# Patient Record
Sex: Female | Born: 1935 | Race: Black or African American | Hispanic: No | Smoking: Never smoker
Health system: Southern US, Community
[De-identification: ages and names within clinical notes are randomized; demographics above are authoritative.]

## PROBLEM LIST (undated history)

## (undated) DIAGNOSIS — R221 Localized swelling, mass and lump, neck: Secondary | ICD-10-CM

## (undated) DIAGNOSIS — E119 Type 2 diabetes mellitus without complications: Secondary | ICD-10-CM

## (undated) DIAGNOSIS — I350 Nonrheumatic aortic (valve) stenosis: Secondary | ICD-10-CM

## (undated) DIAGNOSIS — I10 Essential (primary) hypertension: Secondary | ICD-10-CM

## (undated) DIAGNOSIS — K529 Noninfective gastroenteritis and colitis, unspecified: Secondary | ICD-10-CM

## (undated) DIAGNOSIS — R0789 Other chest pain: Secondary | ICD-10-CM

## (undated) DIAGNOSIS — I679 Cerebrovascular disease, unspecified: Secondary | ICD-10-CM

## (undated) DIAGNOSIS — I871 Compression of vein: Secondary | ICD-10-CM

## (undated) DIAGNOSIS — Z950 Presence of cardiac pacemaker: Secondary | ICD-10-CM

## (undated) DIAGNOSIS — J439 Emphysema, unspecified: Secondary | ICD-10-CM

## (undated) DIAGNOSIS — R9430 Abnormal result of cardiovascular function study, unspecified: Secondary | ICD-10-CM

## (undated) DIAGNOSIS — I739 Peripheral vascular disease, unspecified: Secondary | ICD-10-CM

## (undated) HISTORY — DX: Noninfective gastroenteritis and colitis, unspecified: K52.9

## (undated) HISTORY — DX: Cerebrovascular disease, unspecified: I67.9

## (undated) HISTORY — DX: Emphysema, unspecified: J43.9

## (undated) HISTORY — DX: Localized swelling, mass and lump, neck: R22.1

## (undated) HISTORY — DX: Type 2 diabetes mellitus without complications: E11.9

## (undated) HISTORY — DX: Nonrheumatic aortic (valve) stenosis: I35.0

## (undated) HISTORY — DX: Peripheral vascular disease, unspecified: I73.9

## (undated) HISTORY — DX: Essential (primary) hypertension: I10

## (undated) HISTORY — DX: Abnormal result of cardiovascular function study, unspecified: R94.30

## (undated) HISTORY — DX: Other chest pain: R07.89

## (undated) HISTORY — DX: Presence of cardiac pacemaker: Z95.0

## (undated) HISTORY — DX: Compression of vein: I87.1

---

## 2004-11-08 ENCOUNTER — Inpatient Hospital Stay (HOSPITAL_COMMUNITY): Admission: AD | Admit: 2004-11-08 | Discharge: 2004-11-16 | Payer: Self-pay | Admitting: Cardiology

## 2004-11-08 ENCOUNTER — Ambulatory Visit: Payer: Self-pay | Admitting: Cardiology

## 2004-11-10 ENCOUNTER — Ambulatory Visit: Payer: Self-pay | Admitting: Pulmonary Disease

## 2004-11-11 ENCOUNTER — Ambulatory Visit: Payer: Self-pay | Admitting: Cardiology

## 2004-11-13 ENCOUNTER — Ambulatory Visit: Payer: Self-pay | Admitting: Cardiology

## 2004-11-13 ENCOUNTER — Encounter: Payer: Self-pay | Admitting: Cardiology

## 2004-11-30 ENCOUNTER — Ambulatory Visit: Payer: Self-pay | Admitting: Cardiology

## 2006-05-02 ENCOUNTER — Ambulatory Visit: Payer: Self-pay | Admitting: Cardiology

## 2006-05-23 ENCOUNTER — Ambulatory Visit: Payer: Self-pay | Admitting: Cardiology

## 2006-12-19 ENCOUNTER — Ambulatory Visit: Payer: Self-pay | Admitting: Cardiology

## 2006-12-27 ENCOUNTER — Ambulatory Visit: Payer: Self-pay | Admitting: Cardiology

## 2007-04-04 ENCOUNTER — Ambulatory Visit: Payer: Self-pay | Admitting: Gastroenterology

## 2007-04-09 ENCOUNTER — Ambulatory Visit: Payer: Self-pay | Admitting: Gastroenterology

## 2007-04-22 ENCOUNTER — Ambulatory Visit (HOSPITAL_COMMUNITY): Admission: RE | Admit: 2007-04-22 | Discharge: 2007-04-22 | Payer: Self-pay | Admitting: Gastroenterology

## 2007-04-22 ENCOUNTER — Ambulatory Visit: Payer: Self-pay | Admitting: Gastroenterology

## 2007-05-22 ENCOUNTER — Ambulatory Visit: Payer: Self-pay | Admitting: Gastroenterology

## 2007-06-17 ENCOUNTER — Ambulatory Visit: Payer: Self-pay | Admitting: Cardiology

## 2007-06-17 ENCOUNTER — Encounter: Payer: Self-pay | Admitting: Cardiology

## 2007-06-25 ENCOUNTER — Ambulatory Visit: Payer: Self-pay | Admitting: Cardiology

## 2007-08-03 ENCOUNTER — Encounter: Payer: Self-pay | Admitting: Cardiology

## 2007-08-06 ENCOUNTER — Ambulatory Visit: Payer: Self-pay | Admitting: Gastroenterology

## 2007-08-11 ENCOUNTER — Ambulatory Visit (HOSPITAL_COMMUNITY): Admission: RE | Admit: 2007-08-11 | Discharge: 2007-08-11 | Payer: Self-pay | Admitting: Gastroenterology

## 2007-08-14 ENCOUNTER — Ambulatory Visit: Payer: Self-pay | Admitting: Cardiology

## 2007-08-18 ENCOUNTER — Ambulatory Visit: Payer: Self-pay | Admitting: Cardiology

## 2007-09-03 ENCOUNTER — Ambulatory Visit: Payer: Self-pay | Admitting: Cardiology

## 2007-09-16 ENCOUNTER — Encounter: Payer: Self-pay | Admitting: Cardiology

## 2007-09-16 ENCOUNTER — Ambulatory Visit: Payer: Self-pay | Admitting: Cardiology

## 2007-10-27 ENCOUNTER — Ambulatory Visit: Payer: Self-pay | Admitting: Cardiology

## 2007-11-11 ENCOUNTER — Ambulatory Visit: Payer: Self-pay | Admitting: Gastroenterology

## 2007-12-16 ENCOUNTER — Ambulatory Visit: Payer: Self-pay | Admitting: Cardiology

## 2008-03-10 ENCOUNTER — Ambulatory Visit: Payer: Self-pay | Admitting: Cardiology

## 2008-03-24 ENCOUNTER — Ambulatory Visit: Payer: Self-pay | Admitting: Cardiology

## 2008-04-02 ENCOUNTER — Ambulatory Visit: Payer: Self-pay | Admitting: Cardiology

## 2008-06-24 ENCOUNTER — Ambulatory Visit: Payer: Self-pay | Admitting: Cardiology

## 2008-08-02 ENCOUNTER — Ambulatory Visit: Payer: Self-pay | Admitting: Cardiology

## 2008-08-06 ENCOUNTER — Encounter: Payer: Self-pay | Admitting: Cardiology

## 2008-08-29 ENCOUNTER — Encounter: Payer: Self-pay | Admitting: Cardiology

## 2008-09-23 ENCOUNTER — Ambulatory Visit: Payer: Self-pay | Admitting: Cardiology

## 2008-09-30 ENCOUNTER — Encounter: Payer: Self-pay | Admitting: Cardiology

## 2008-10-04 ENCOUNTER — Encounter: Payer: Self-pay | Admitting: Cardiology

## 2008-10-04 ENCOUNTER — Encounter: Payer: Self-pay | Admitting: Physician Assistant

## 2008-10-05 ENCOUNTER — Ambulatory Visit: Payer: Self-pay | Admitting: Cardiology

## 2008-11-16 ENCOUNTER — Encounter: Payer: Self-pay | Admitting: Cardiology

## 2008-11-29 ENCOUNTER — Encounter (INDEPENDENT_AMBULATORY_CARE_PROVIDER_SITE_OTHER): Payer: Self-pay | Admitting: *Deleted

## 2008-12-03 ENCOUNTER — Encounter: Payer: Self-pay | Admitting: Cardiology

## 2008-12-04 ENCOUNTER — Ambulatory Visit: Payer: Self-pay | Admitting: Cardiology

## 2008-12-10 ENCOUNTER — Encounter: Payer: Self-pay | Admitting: Cardiology

## 2008-12-16 ENCOUNTER — Encounter: Payer: Self-pay | Admitting: Cardiology

## 2009-03-14 ENCOUNTER — Encounter: Payer: Self-pay | Admitting: Cardiology

## 2009-03-31 ENCOUNTER — Encounter: Payer: Self-pay | Admitting: Cardiology

## 2009-05-16 ENCOUNTER — Encounter: Payer: Self-pay | Admitting: Cardiology

## 2009-05-31 ENCOUNTER — Encounter: Payer: Self-pay | Admitting: Cardiology

## 2009-06-16 ENCOUNTER — Ambulatory Visit: Payer: Self-pay | Admitting: Cardiology

## 2009-06-16 DIAGNOSIS — I4949 Other premature depolarization: Secondary | ICD-10-CM

## 2009-06-16 DIAGNOSIS — D649 Anemia, unspecified: Secondary | ICD-10-CM

## 2009-06-16 DIAGNOSIS — I5033 Acute on chronic diastolic (congestive) heart failure: Secondary | ICD-10-CM

## 2009-06-16 DIAGNOSIS — I359 Nonrheumatic aortic valve disorder, unspecified: Secondary | ICD-10-CM | POA: Insufficient documentation

## 2009-06-16 DIAGNOSIS — N182 Chronic kidney disease, stage 2 (mild): Secondary | ICD-10-CM | POA: Insufficient documentation

## 2009-06-16 DIAGNOSIS — R0989 Other specified symptoms and signs involving the circulatory and respiratory systems: Secondary | ICD-10-CM

## 2009-09-29 ENCOUNTER — Encounter: Payer: Self-pay | Admitting: Cardiology

## 2009-10-03 ENCOUNTER — Ambulatory Visit: Payer: Self-pay | Admitting: Cardiology

## 2009-10-17 IMAGING — RF DG ESOPHAGUS
16 of 24 series · 16 of 24 positions shown · non-contrast
Comparison: None

CLINICAL DATA: Regurgitation, acid reflux

BARIUM SWALLOW / ESOPHAGRAM
TECHNIQUE: Routine esophagram performed including single contrast,
air contrast, and tablet imaging.  Exam utilized 69 seconds of
fluoroscopy.

[Series 1: run · 1 of 1 slices shown (1 of 16)]
[im 1/1]
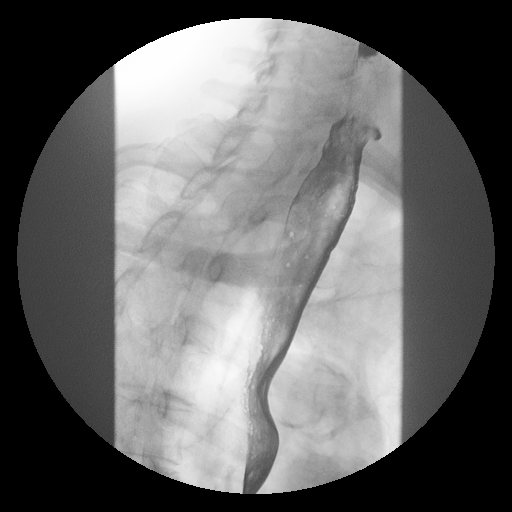

[Series 3: run · 1 of 1 slices shown (2 of 16)]
[im 1/1]
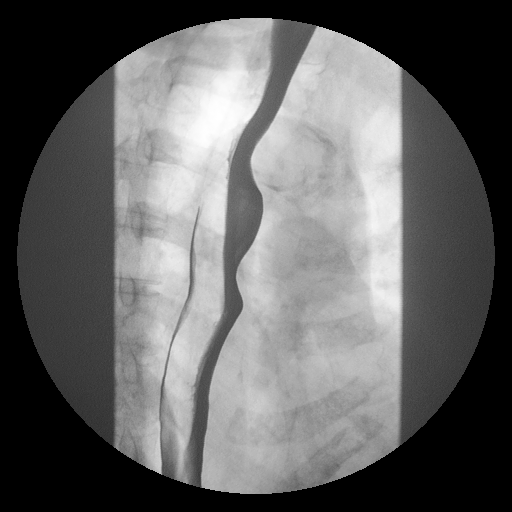

[Series 4: run · 1 of 1 slices shown (3 of 16)]
[im 1/1]
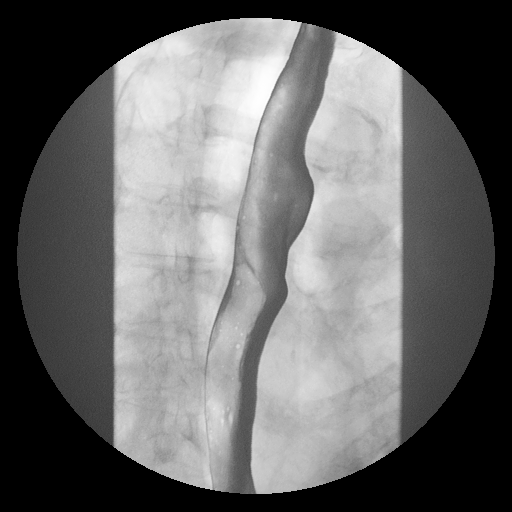

[Series 6: run · 1 of 1 slices shown (4 of 16)]
[im 1/1]
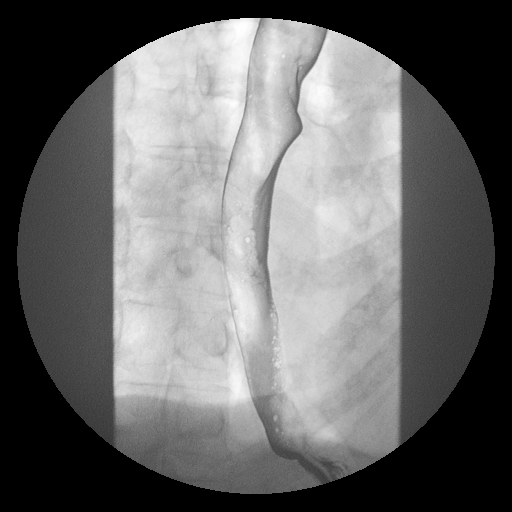

[Series 7: run · 1 of 1 slices shown (5 of 16)]
[im 1/1]
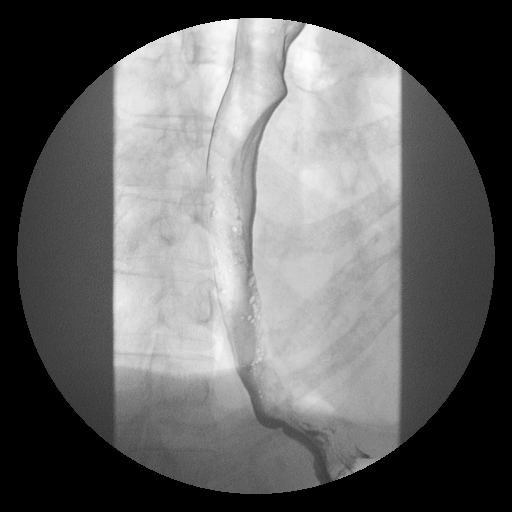

[Series 9: run · 1 of 1 slices shown (6 of 16)]
[im 1/1]
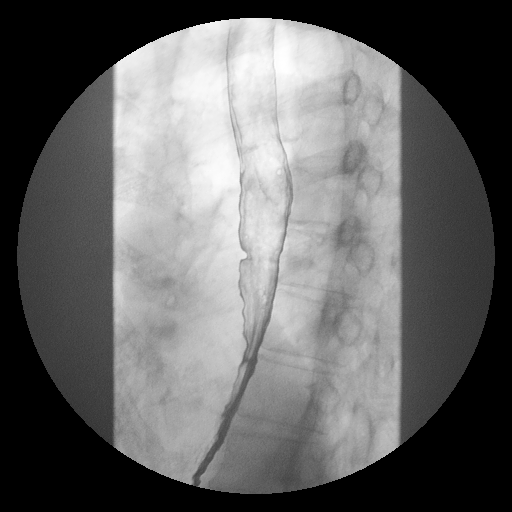

[Series 10: run · 1 of 1 slices shown (7 of 16)]
[im 1/1]
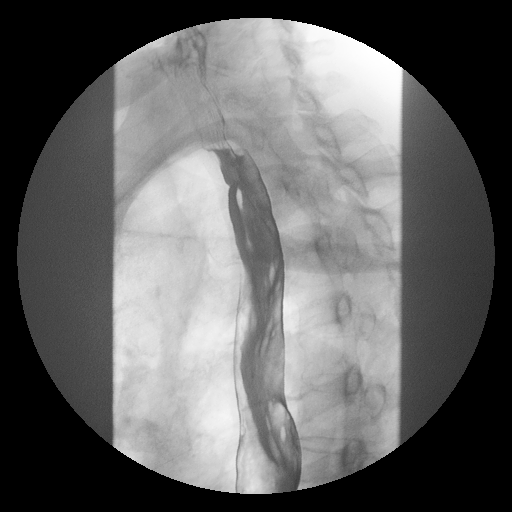

[Series 12: run · 1 of 1 slices shown (8 of 16)]
[im 1/1]
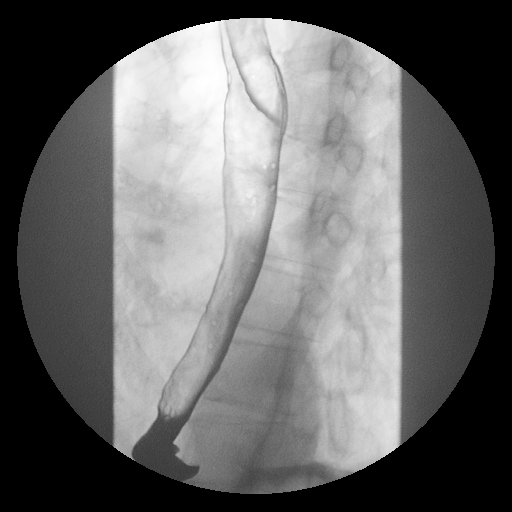

[Series 13: run · 1 of 1 slices shown (9 of 16)]
[im 1/1]
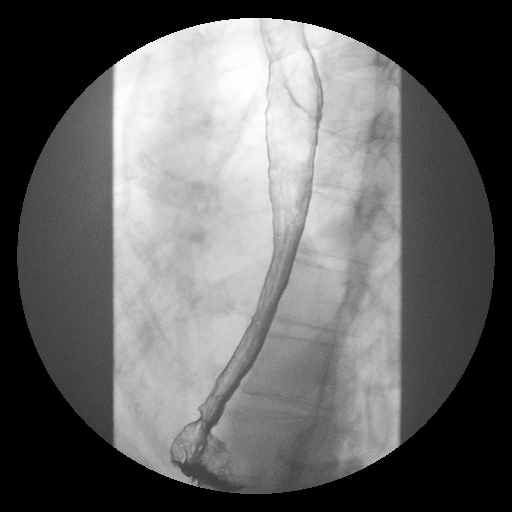

[Series 15: run · 1 of 1 slices shown (10 of 16)]
[im 1/1]
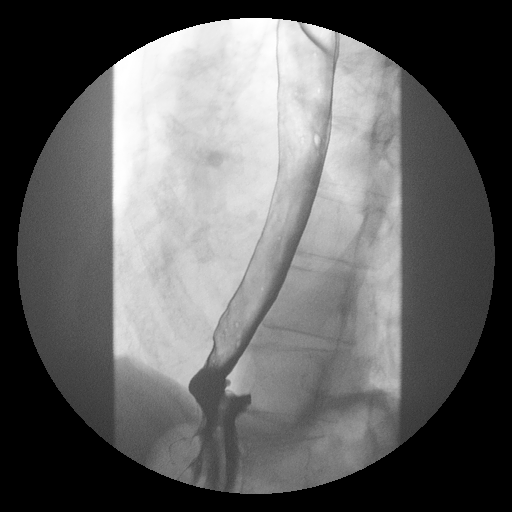

[Series 16: run · 1 of 1 slices shown (11 of 16)]
[im 1/1]
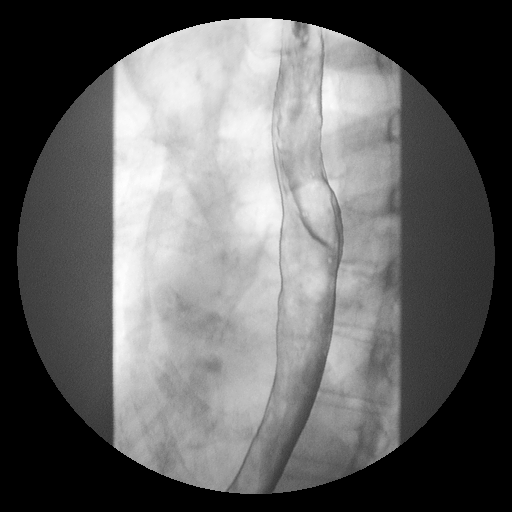

[Series 18: run · 1 of 18 slices shown (12 of 16)]
[im 1/18]
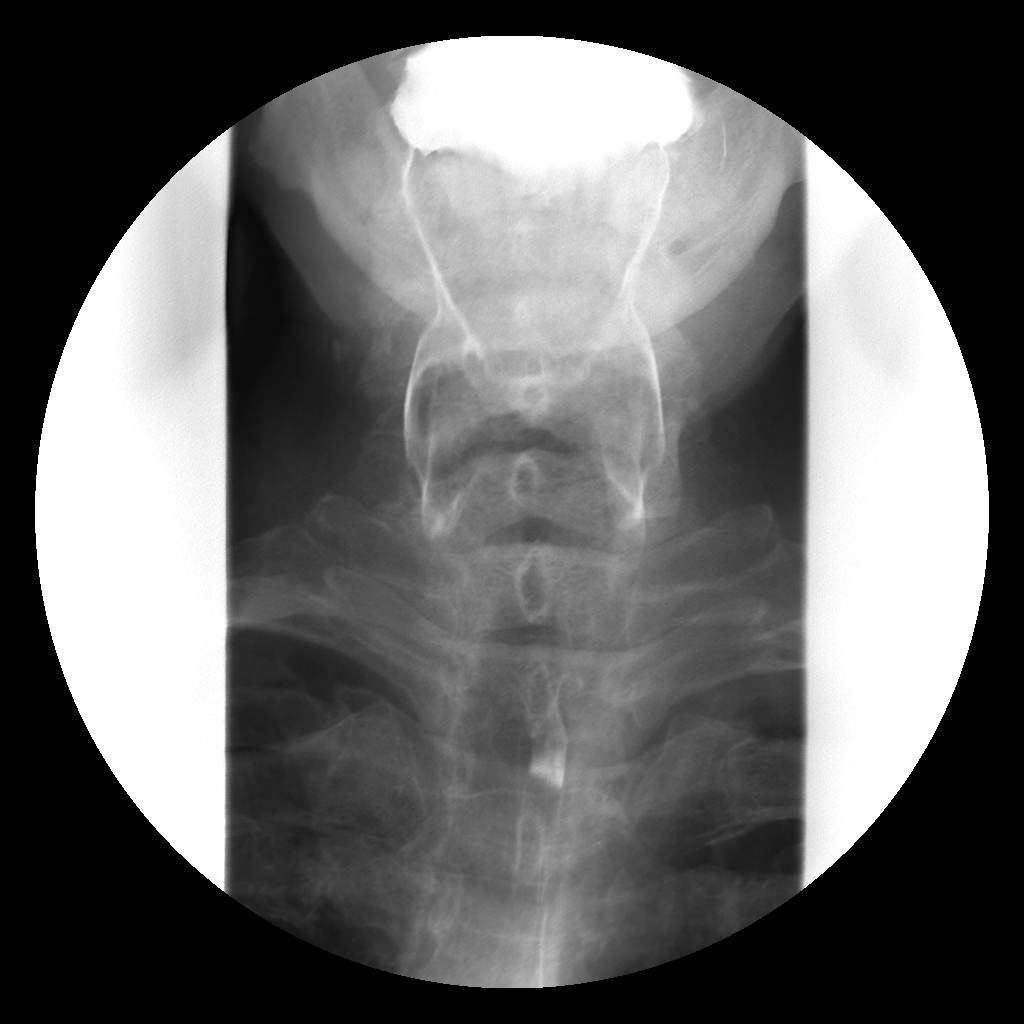

[Series 19: run · 1 of 1 slices shown (13 of 16)]
[im 1/1]
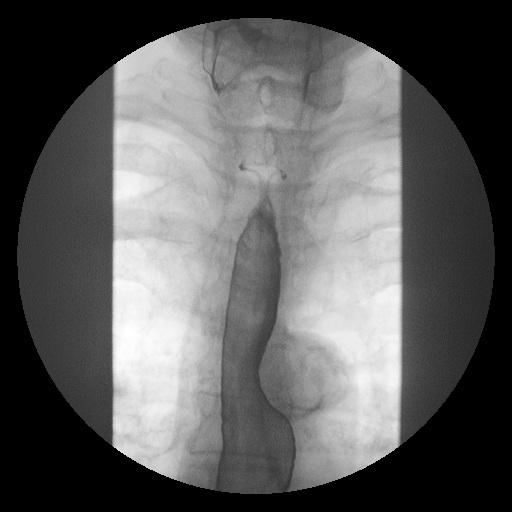

[Series 21: run · 1 of 1 slices shown (14 of 16)]
[im 1/1]
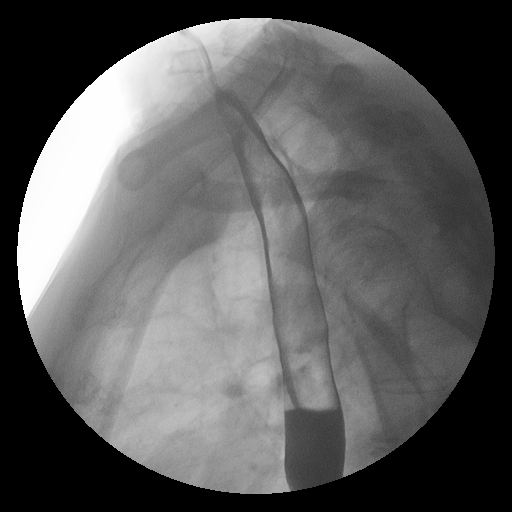

[Series 22: run · 1 of 1 slices shown (15 of 16)]
[im 1/1]
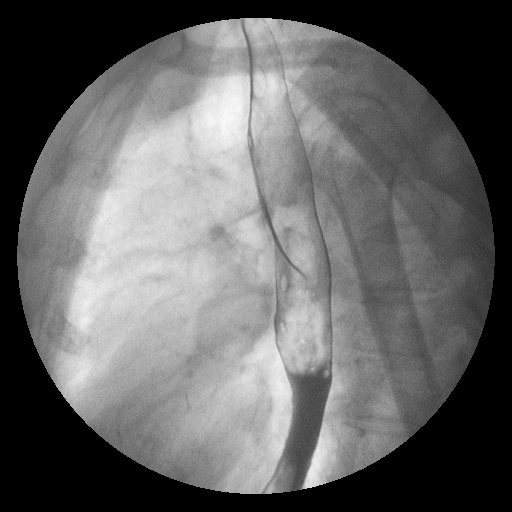

[Series 24: run · 1 of 1 slices shown (16 of 16)]
[im 1/1]
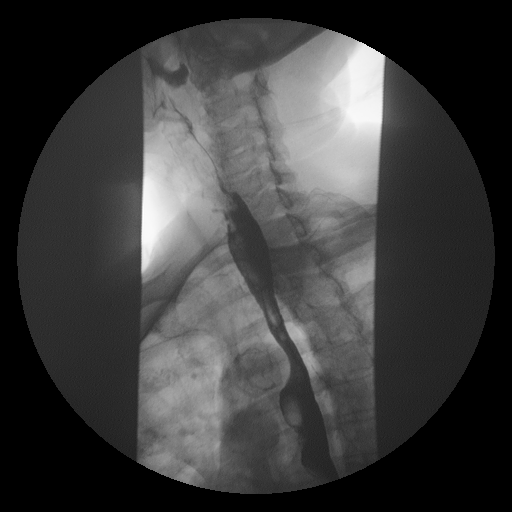

[16 of 24 positions shown; findings below may reference images not displayed]

FINDINGS: Normal esophageal distension.
Minimal age-related esophageal dysmotility.
12.5 mm diameter barium tablet freely passes from oral cavity to
stomach without delay.
Mucosa smooth without ulceration or irregularity.
No mass or stricture identified.
Tiny hiatal hernia noted with nonobstructing Schatzki's ring.
Targeted rapid sequence imaging of cervical esophagus and
hypopharynx demonstrates questionable tiny cervical esophageal
diverticulum, left lateral, seen only on the AP imaging.
No laryngeal penetration or aspiration.
IMPRESSION: Tiny hiatal hernia.
Small nonobstructing Schatzki's ring.
No other esophageal abnormalities.

## 2009-11-04 ENCOUNTER — Encounter: Payer: Self-pay | Admitting: Cardiology

## 2009-11-15 ENCOUNTER — Encounter: Payer: Self-pay | Admitting: Cardiology

## 2009-11-16 ENCOUNTER — Encounter: Payer: Self-pay | Admitting: Cardiology

## 2009-11-24 ENCOUNTER — Ambulatory Visit: Payer: Self-pay | Admitting: Cardiology

## 2009-11-24 DIAGNOSIS — Z950 Presence of cardiac pacemaker: Secondary | ICD-10-CM

## 2009-12-07 ENCOUNTER — Encounter: Payer: Self-pay | Admitting: Cardiology

## 2009-12-23 ENCOUNTER — Telehealth (INDEPENDENT_AMBULATORY_CARE_PROVIDER_SITE_OTHER): Payer: Self-pay | Admitting: *Deleted

## 2010-01-11 ENCOUNTER — Encounter: Payer: Self-pay | Admitting: Cardiology

## 2010-03-22 ENCOUNTER — Ambulatory Visit: Payer: Self-pay | Admitting: Cardiology

## 2010-03-28 ENCOUNTER — Encounter: Payer: Self-pay | Admitting: Cardiology

## 2010-03-28 ENCOUNTER — Telehealth (INDEPENDENT_AMBULATORY_CARE_PROVIDER_SITE_OTHER): Payer: Self-pay | Admitting: *Deleted

## 2010-03-29 ENCOUNTER — Ambulatory Visit: Payer: Self-pay | Admitting: Cardiology

## 2010-04-20 ENCOUNTER — Encounter: Payer: Self-pay | Admitting: Cardiology

## 2010-05-30 NOTE — Cardiovascular Report (Signed)
Summary: Cardiac Cath [facility]  Cardiac Cath [facility]   Imported By: Dorise Hiss 07/15/2009 15:10:10  _____________________________________________________________________  External Attachment:    Type:   Image     Comment:   External Document

## 2010-05-30 NOTE — Assessment & Plan Note (Signed)
Summary: 6 month fu   Visit Type:  Follow-up Primary Provider:  Sherril Croon  CC:  follow-up visit.  History of Present Illness: the patient is an 77 year old female with history of severe aortic stenosis. The patient is being evaluated for TAVI. She had a prior balloon angioplasty of the aortic valve. She continues to have severe aortic stenosis. She had a hospitalization for heart failure several months ago. Her symptoms have improved. She also had a workup with catheterization at [facility] showing no significant CAD. She is now followed closely by [facility]. The patient denies any orthopnea PND palpitations or syncope.  Preventive Screening-Counseling & Management  Alcohol-Tobacco     Smoking Status: never  Current Problems (verified): 1)  Acute On Chronic Diastolic Heart Failure  (ICD-428.33) 2)  Carotid Bruit  (ICD-785.9) 3)  Renal Disease, Chronic, Mild  (ICD-585.2) 4)  Anemia, Normocytic  (ICD-285.9) 5)  Ventricular Ectopy  (ICD-427.69) 6)  Aortic Stenosis, Severe  (ICD-424.1)  Current Medications (verified): 1)  Metoprolol Tartrate 50 Mg Tabs (Metoprolol Tartrate) .... Take One Tablet By Mouth Twice A Day 2)  Spiriva Handihaler 18 Mcg Caps (Tiotropium Bromide Monohydrate) .... One Daily 3)  Advair Diskus 250-50 Mcg/dose Misc (Fluticasone-Salmeterol) .... Twice A Day 4)  Baby Aspirin 81 Mg Chew (Aspirin) .... One Tablet By Mouth Daily 5)  Multivitamins  Tabs (Multiple Vitamin) .... One Tablet By Mouth Daily 6)  Protonix 40 Mg Pack (Pantoprazole Sodium) .... One Tablet By Mouth Two Times A Day 7)  Atacand 32 Mg Tabs (Candesartan Cilexetil) .... One Half Tablet By Mouth Two Times Daily 8)  Airs Disposable Nebulizer  Misc (Nebulizers) .... 4 Times A Day 9)  Norvasc 5 Mg Tabs (Amlodipine Besylate) .... Take 1 Tablet By Mouth Two Times A Day 10)  Clonidine Hcl 0.1 Mg/24hr Ptwk (Clonidine Hcl) .... Apply Once A Week 11)  Bumetanide 0.5 Mg Tabs (Bumetanide) .... P.r.n. As  Needed 12)  Albuterol Mdi .... P.r.n. As Needed 13)  Albuterol Nebulizer Solution .... P.r.n. As Needed 14)  Nitrostat 0.4 Mg Subl (Nitroglycerin) .... Place 1 Tablet Under Tongue As Directed 15)  Imdur 30 Mg Xr24h-Tab (Isosorbide Mononitrate) .... Take 1 Tablet By Mouth Once A Day 16)  Iron 325 (65 Fe) Mg Tabs (Ferrous Sulfate) .Marland Kitchen.. 1tablet By Mouth Once Daily 17)  Pletal 100 Mg Tabs (Cilostazol) .... Take 1 Tablet By Mouth Two Times A Day 18)  Caltrate 600+d 600-400 Mg-Unit Tabs (Calcium Carbonate-Vitamin D) .... Take 1 Tablet By Mouth Two Times A Day 19)  Claritin 10 Mg Tabs (Loratadine) .... Take 1 Tablet By Mouth Once A Day  Allergies (verified): 1)  ! * (Ace Inhibitors)  Comments:  Nurse/Medical Assistant: The patient's medications and allergies were reviewed with the patient and were updated in the Medication and Allergy Lists. List reviewed.  Past History:  Past Medical History: Last updated: 06/16/2009 severe aortic stenosis normal coronary arteries. patient had percutaneous valvuloplasty done by balloon catheter at [facility].  This was done in May of 2009 peripheral vascular disease type II diabetes asthma hypertension history of gastroenteritis.  Admission last was 06-01-2005 echocardiogram in 2008 showed preserved LV function with ejection fraction of 55 to 60%. Vavle Diameter 0.9 to 0.1 cm per square chronic diastolic heart failure atypical chest pain chronic obstructive pulmonary disease peripheral artery disease noncritical cerebrovascular disease  Family History: Last updated: 06/23/2008 Negative FH of Diabetes, Hypertension, or Coronary Artery Disease  Social History: Last updated: 06/23/2008 Tobacco Use - No.  Alcohol Use - no  Risk Factors: Smoking Status: never (06/16/2009)  Review of Systems       The patient complains of shortness of breath and leg swelling.  The patient denies fatigue, malaise, fever, weight gain/loss, vision loss,  decreased hearing, hoarseness, chest pain, palpitations, prolonged cough, wheezing, sleep apnea, coughing up blood, abdominal pain, blood in stool, nausea, vomiting, diarrhea, heartburn, incontinence, blood in urine, muscle weakness, joint pain, rash, skin lesions, headache, fainting, dizziness, depression, anxiety, enlarged lymph nodes, easy bruising or bleeding, and environmental allergies.    Vital Signs:  Patient profile:   77 year old female Height:      61 inches Weight:      121 pounds Pulse rate:   97 / minute BP sitting:   127 / 65  (left arm) Cuff size:   regular  Vitals Entered By: Carlye Grippe (June 16, 2009 9:50 AM) CC: follow-up visit   Physical Exam  Additional Exam:  General: Well-developed, well-nourished in no distress head: Normocephalic and atraumatic eyes PERRLA/EOMI intact, conjunctiva and lids normal nose: No deformity or lesions mouth normal dentition, normal posterior pharynx neck: Supple, no JVD.  No masses, thyromegaly or abnormal cervical nodes. diminished carotid upstroke with soft bilateral bruits. lungs: Normal breath sounds bilaterally without wheezing.  Normal percussion heart: regular rate and rhythm with normal S1 and S2, no S3 or S4.  PMI is normal.  there is a harsh 3-4/6 crescendo decrescendo murmur at the right upper sternal border. abdomen: Normal bowel sounds, abdomen is soft and nontender without masses, organomegaly or hernias noted.  No hepatosplenomegaly musculoskeletal: Back normal, normal gait muscle strength and tone normal pulsus: Pulse is normal in all 4 extremities Extremities: 1+ peripheral pitting edema neurologic: Alert and oriented x 3 skin: Intact without lesions or rashes cervical nodes: No significant adenopathy psychologic: Normal affect    Impression & Recommendations:  Problem # 1:  ACUTE ON CHRONIC DIASTOLIC HEART FAILURE (ICD-428.33) the patient is currently euvolemic. I made no changes in her diuretic  regimen. The following medications were removed from the medication list:    Lopressor 50 Mg Tabs (Metoprolol tartrate) .Marland Kitchen... Take 1/2 tablet by mouth twice a day Her updated medication list for this problem includes:    Metoprolol Tartrate 50 Mg Tabs (Metoprolol tartrate) .Marland Kitchen... Take one tablet by mouth twice a day    Baby Aspirin 81 Mg Chew (Aspirin) ..... One tablet by mouth daily    Atacand 32 Mg Tabs (Candesartan cilexetil) ..... One half tablet by mouth two times daily    Norvasc 5 Mg Tabs (Amlodipine besylate) .Marland Kitchen... Take 1 tablet by mouth two times a day    Bumetanide 0.5 Mg Tabs (Bumetanide) .Marland Kitchen... P.r.n. as needed    Nitrostat 0.4 Mg Subl (Nitroglycerin) .Marland Kitchen... Place 1 tablet under tongue as directed    Imdur 30 Mg Xr24h-tab (Isosorbide mononitrate) .Marland Kitchen... Take 1 tablet by mouth once a day    Pletal 100 Mg Tabs (Cilostazol) .Marland Kitchen... Take 1 tablet by mouth two times a day  Problem # 2:  CAROTID BRUIT (ICD-785.9) Assessment: Comment Only  Problem # 3:  AORTIC STENOSIS, SEVERE (ICD-424.1) Assessment: Comment Only the patient is followed closely at [facility] and may be scheduled for percutaneous valve implantation. The following medications were removed from the medication list:    Lopressor 50 Mg Tabs (Metoprolol tartrate) .Marland Kitchen... Take 1/2 tablet by mouth twice a day Her updated medication list for this problem includes:    Metoprolol Tartrate 50 Mg  Tabs (Metoprolol tartrate) .Marland Kitchen... Take one tablet by mouth twice a day    Atacand 32 Mg Tabs (Candesartan cilexetil) ..... One half tablet by mouth two times daily    Bumetanide 0.5 Mg Tabs (Bumetanide) .Marland Kitchen... P.r.n. as needed    Nitrostat 0.4 Mg Subl (Nitroglycerin) .Marland Kitchen... Place 1 tablet under tongue as directed    Imdur 30 Mg Xr24h-tab (Isosorbide mononitrate) .Marland Kitchen... Take 1 tablet by mouth once a day  Problem # 4:  RENAL DISEASE, CHRONIC, MILD (ICD-585.2) Assessment: Comment Only  Patient Instructions: 1)  Your physician recommends that  you continue on your current medications as directed. Please refer to the Current Medication list given to you today. 2)  Follow up in  6 months.

## 2010-05-30 NOTE — Letter (Signed)
Summary: Appointment- Rescheduled  [facility] [address]   [place], [place] [address]   Phone: [phone]  Fax: [phone]     November 15, 2009 MRN: [id number]     [name] [address] [place], [place]  [address]     Dear Ms. Stoker,   Due to a change in our office schedule, your appointment on  July 28,2011 at 2;45 pm must be changed.    Your new appointment time on July 28th, 2011  will be 2:15 pm.   We look forward to participating in your health care needs.     Sincerely,  Glass blower/designer

## 2010-05-30 NOTE — Progress Notes (Signed)
Summary: edema in ankles  Phone Note Call from Patient   Summary of Call: Patient c/o edema in ankles and just a little going up into legs.  Going on x 1 week.  No weight gain.  Does have SOB, but states is normal for her.  Did call PMD and they told her to call Cardiologist since it may be related to one of the meds given to her at [facility].   Hoover Brunette, LPN  December 23, 2009 5:16 PM   Follow-up for Phone Call        probably related to Norvasc.  Patient should check blood pressure is within normal limits will discontinue Norvasc and recheck her blood pressure in about a week.  It is significantly elevated we can start hydralazine. Follow-up by: Lewayne Bunting, MD, [facility],  December 25, 2009 5:48 PM  Additional Follow-up for Phone Call Additional follow up Details #1::        Patient informed of the above. Patient will call nurse back with BP reading. BP135/59 & 122/58. OK- stable Lewayne Bunting, MD, [facility]  December 28, 2009 12:46 PM   Additional Follow-up by: Carlye Grippe,  December 27, 2009 10:48 AM    Additional Follow-up for Phone Call Additional follow up Details #2::    Patient informed of the above.  Follow-up by: Carlye Grippe,  December 28, 2009 2:50 PM

## 2010-05-30 NOTE — Letter (Signed)
Summary: External Correspondence/ OFFICE NOTE DR. Regino Schultze [facility]  External Correspondence/ OFFICE NOTE DR. Regino Schultze [facility]   Imported By: Dorise Hiss 10/05/2009 16:20:12  _____________________________________________________________________  External Attachment:    Type:   Image     Comment:   External Document

## 2010-05-30 NOTE — Cardiovascular Report (Signed)
Summary: [facility]  DEVICE IND CARD  [facility]  DEVICE IND CARD   Imported By: Claudette Laws 11/24/2009 14:22:46  _____________________________________________________________________  External Attachment:    Type:   Image     Comment:   External Document

## 2010-05-30 NOTE — Letter (Signed)
Summary: External Correspondence/ [facility]INIC [facility]  External Correspondence/ [facility]IC [facility]   Imported By: Dorise Hiss 12/13/2009 10:15:28  _____________________________________________________________________  External Attachment:    Type:   Image     Comment:   External Document

## 2010-05-30 NOTE — Progress Notes (Signed)
Summary: SCHEDULE TEE      Phone Note Outgoing Call   Summary of Call: Need to schedule TEE per Dr. Andee Lineman voice dictation.  (aortic stenosis - r/o any embolic source from transcutaneous aortic valve implants) D/C on 11/25 from [place].  Informed her that MD could posssibly do on 11/30 or 12/1.  She will call back and let us know if either date okay since she will have to see if she can work out transportation.   Initial call taken by: Hoover Brunette, LPN,  March 28, 2010 10:00 AM  Follow-up for Phone Call        Patient called back earlier and states she is able to go tomorrow.   Scheduled for Wednesday, 11/30 at 8:30.  Will notify pt. Follow-up by: Hoover Brunette, LPN,  March 28, 2010 4:49 PM

## 2010-05-30 NOTE — Letter (Signed)
Summary: External Correspondence/ CLINIC NOTE [facility] DR. HARRISON   External Correspondence/ CLINIC NOTE [facility] DR. HARRISON   Imported By: Dorise Hiss 01/23/2010 09:42:30  _____________________________________________________________________  External Attachment:    Type:   Image     Comment:   External Document

## 2010-05-30 NOTE — Assessment & Plan Note (Signed)
Summary: f/u from cor valve surgery at [facility]  --agh   Visit Type:  Follow-up Primary Provider:  Sherril Croon   History of Present Illness: the patient is a 77 year old female history of severe calcific aortic stenosis. Several years ago the patient underwent a balloon aortic valvuloplasty. She now has recently undergone on 628 2011 a core valve procedure TAVI at [facility]. She was enrolled as part of a core valve trial. Several days after her procedure she required a permanent pacemaker. Otherwise she'll uneventful postoperative course.  The patient is doing extremely well. She stated she was much less short of breath. She has no orthopnea PND palpitations or syncope she denies any chest pain. She reports no complications at the site of her insertion which was an open right femoral artery exposure by groin incision.  Preventive Screening-Counseling & Management  Alcohol-Tobacco     Smoking Status: never  Current Medications (verified): 1)  Toprol Xl 50 Mg Xr24h-Tab (Metoprolol Succinate) .... Take 1 Tablet By Mouth Once A Day 2)  Spiriva Handihaler 18 Mcg Caps (Tiotropium Bromide Monohydrate) .... One Daily 3)  Advair Diskus 250-50 Mcg/dose Misc (Fluticasone-Salmeterol) .... Twice A Day 4)  Baby Aspirin 81 Mg Chew (Aspirin) .... One Tablet By Mouth Daily 5)  Multivitamins  Tabs (Multiple Vitamin) .... One Tablet By Mouth Daily 6)  Protonix 40 Mg Pack (Pantoprazole Sodium) .... One Tablet By Mouth Two Times A Day 7)  Amlodipine Besylate 10 Mg Tabs (Amlodipine Besylate) .... Take 1 Tablet By Mouth Once A Day 8)  Clonidine Hcl 0.2 Mg/24hr Ptwk (Clonidine Hcl) .... Apply One Patch Weekly 9)  Ventolin Hfa 108 (90 Base) Mcg/act Aers (Albuterol Sulfate) .... Use As Directed 10)  Albuterol Sulfate (2.5 Mg/53ml) 0.083% Nebu (Albuterol Sulfate) .... Use As Directed 11)  Nitrostat 0.4 Mg Subl (Nitroglycerin) .... Place 1 Tablet Under Tongue As Directed 12)  Imdur 30 Mg Xr24h-Tab  (Isosorbide Mononitrate) .... Take 1 Tablet By Mouth Once A Day 13)  Iron 325 (65 Fe) Mg Tabs (Ferrous Sulfate) .... Take 1 Tablet By Mouth Two Times A Day 14)  Pletal 100 Mg Tabs (Cilostazol) .... Take 1 Tablet By Mouth Two Times A Day 15)  Caltrate 600+d 600-400 Mg-Unit Tabs (Calcium Carbonate-Vitamin D) .... Take 1 Tablet By Mouth Two Times A Day 16)  Claritin 10 Mg Tabs (Loratadine) .... Take 1 Tablet By Mouth Once A Day 17)  Nystatin 100000 Unit/ml Susp (Nystatin) .Marland Kitchen.. 1ml By Mouth Three Times A Day As Needed 18)  Plavix 75 Mg Tabs (Clopidogrel Bisulfate) .... Take 1 Tablet By Mouth Once A Day 19)  Tylenol 325 Mg Tabs (Acetaminophen) .... As Needed 20)  Senokot 8.6 Mg Tabs (Sennosides) .... Take 2 Tablet By Mouth Once A Day 21)  Colace 100 Mg Caps (Docusate Sodium) .... Take 1 Tablet By Mouth Two Times A Day 22)  Multaq 400 Mg Tabs (Dronedarone Hcl) .... Take 1 Tablet By Mouth Two Times A Day  Allergies (verified): 1)  ! * (Ace Inhibitors)  Comments:  Nurse/Medical Assistant: The patient's medication list and allergies were reviewed with the patient and were updated in the Medication and Allergy Lists.  Past History:  Past Medical History: severe aortic stenosis normal coronary arteries. patient had percutaneous valvuloplasty done by balloon catheter at [facility].  This was done in May of 2009 s/p Core Valve for aortic stenosis June of 2011 TAVI procedure peripheral vascular disease type II diabetes asthma hypertension history of gastroenteritis.  Admission last  was 06-01-2005 echocardiogram in 2008 showed preserved LV function with ejection fraction of 55 to 60%. Vavle Diameter 0.9 to 0.1 cm per square chronic diastolic heart failure atypical chest pain chronic obstructive pulmonary disease peripheral artery disease noncritical cerebrovascular disease  Review of Systems  The patient denies fatigue, malaise, fever, weight gain/loss, vision loss, decreased hearing,  hoarseness, chest pain, palpitations, shortness of breath, prolonged cough, wheezing, sleep apnea, coughing up blood, abdominal pain, blood in stool, nausea, vomiting, diarrhea, heartburn, incontinence, blood in urine, muscle weakness, joint pain, leg swelling, rash, skin lesions, headache, fainting, dizziness, depression, anxiety, enlarged lymph nodes, easy bruising or bleeding, and environmental allergies.    Vital Signs:  Patient profile:   77 year old female Height:      61 inches Weight:      120 pounds BMI:     22.76 O2 Sat:      98 % on Room air Pulse rate:   80 / minute BP sitting:   109 / 63  (left arm) Cuff size:   regular  Vitals Entered By: Carlye Grippe (November 24, 2009 2:28 PM)  O2 Flow:  Room air  Physical Exam  Additional Exam:  General: Well-developed, well-nourished in no distress head: Normocephalic and atraumatic eyes PERRLA/EOMI intact, conjunctiva and lids normal nose: No deformity or lesions mouth normal dentition, normal posterior pharynx neck: Supple, no JVD.  No masses, thyromegaly or abnormal cervical nodes. diminished carotid upstroke with soft bilateral bruits. lungs: Normal breath sounds bilaterally without wheezing.  Normal percussion heart: regular rate and rhythm with normal S1 and S2, no S3 or S4.  PMI is normal.  abdomen: Normal bowel sounds, abdomen is soft and nontender without masses, organomegaly or hernias noted.  No hepatosplenomegaly musculoskeletal: Back normal, normal gait muscle strength and tone normal pulsus: Pulse is normal in all 4 extremities Extremities: 1+ peripheral pitting edema neurologic: Alert and oriented x 3 skin: Intact without lesions or rashes cervical nodes: No significant adenopathy psychologic: Normal affect    EKG  Procedure date:  11/24/2009  Findings:      AV sequential pacemaker with ventricular pacing. Heart rate 80 beats per minute  Impression & Recommendations:  Problem # 1:  AORTIC STENOSIS, SEVERE  (ICD-424.1) the patient is status post TAVI. She is feeling much better. She will follow up echocardiogram in a few months. The following medications were removed from the medication list:    Atacand 32 Mg Tabs (Candesartan cilexetil) ..... One half tablet by mouth two times daily    Bumetanide 0.5 Mg Tabs (Bumetanide) .Marland Kitchen... P.r.n. as needed Her updated medication list for this problem includes:    Toprol Xl 50 Mg Xr24h-tab (Metoprolol succinate) .Marland Kitchen... Take 1 tablet by mouth once a day    Nitrostat 0.4 Mg Subl (Nitroglycerin) .Marland Kitchen... Place 1 tablet under tongue as directed    Imdur 30 Mg Xr24h-tab (Isosorbide mononitrate) .Marland Kitchen... Take 1 tablet by mouth once a day  Orders: EKG w/ Interpretation (93000)  Problem # 2:  RENAL DISEASE, CHRONIC, MILD (ICD-585.2)  Problem # 3:  ANEMIA, NORMOCYTIC (ICD-285.9) Assessment: Comment Only  Problem # 4:  CARDIAC PACEMAKER IN SITU (ICD-V45.01) followup with Dr. Johney Frame  Patient Instructions: 1)  Your physician recommends that you schedule a follow-up appointment in: YOU WILL SEE DR. ALLRED ON 03/17/10 @3 :00PM HERE ([facility]).  2)  Your physician recommends that you continue on your current medications as directed. Please refer to the Current Medication list given to you today. 3)  Your  physician wants you to follow-up in: 6 MONTHS. You will receive a reminder letter in the mail about two months in advance. If you don't receive a letter, please call our office to schedule the follow-up appointment. 4)  Your refill for spiriva has been sent to your pharmacy. Prescriptions: SPIRIVA HANDIHALER 18 MCG CAPS (TIOTROPIUM BROMIDE MONOHYDRATE) one daily  #1 x 3   Entered by:   Carlye Grippe   Authorized by:   Lewayne Bunting, MD, [facility]   Signed by:   Carlye Grippe on 11/24/2009   Method used:   Electronically to        [facility]* (retail)       [address]       [place], [place]  [address]       Ph: [phone]       Fax: [phone]    RxID:   [id number]

## 2010-05-30 NOTE — Consult Note (Signed)
Summary: CARDIOLOGY CONSULT/ [facility]  CARDIOLOGY CONSULT/ [facility]   Imported By: Zachary George 06/16/2009 08:50:46  _____________________________________________________________________  External Attachment:    Type:   Image     Comment:   External Document

## 2010-05-30 NOTE — Letter (Signed)
Summary: External Correspondence/ [facility]/ [facility]  External Correspondence/ [facility]/ [facility]   Imported By: Dorise Hiss 12/15/2009 11:56:43  _____________________________________________________________________  External Attachment:    Type:   Image     Comment:   External Document

## 2010-05-30 NOTE — Procedures (Signed)
Summary: Holter and Event/ [facility]  Holter and Event/ [facility]   Imported By: Dorise Hiss 06/15/2009 14:44:32  _____________________________________________________________________  External Attachment:    Type:   Image     Comment:   External Document

## 2010-05-30 NOTE — Miscellaneous (Signed)
Summary: Orders Update - TEE  Clinical Lists Changes  Orders: Added new Referral order of Trans Esophageal Echocardiogram (TEE) - Signed 

## 2010-05-30 NOTE — Letter (Signed)
Summary: External Correspondence/ [facility]  External Correspondence/ [facility]   Imported By: Dorise Hiss 11/04/2009 09:59:01  _____________________________________________________________________  External Attachment:    Type:   Image     Comment:   External Document

## 2010-06-01 NOTE — Letter (Signed)
Summary: External Correspondence/ CLINIC NOTE DUKEMEDICINE  External Correspondence/ CLINIC NOTE DUKEMEDICINE   Imported By: Dorise Hiss 05/05/2010 11:54:37  _____________________________________________________________________  External Attachment:    Type:   Image     Comment:   External Document

## 2010-08-27 ENCOUNTER — Encounter: Payer: Self-pay | Admitting: Physician Assistant

## 2010-08-29 ENCOUNTER — Encounter: Payer: Self-pay | Admitting: Physician Assistant

## 2010-08-29 DIAGNOSIS — I359 Nonrheumatic aortic valve disorder, unspecified: Secondary | ICD-10-CM

## 2010-08-29 DIAGNOSIS — R079 Chest pain, unspecified: Secondary | ICD-10-CM

## 2010-09-12 NOTE — Op Note (Signed)
[name], [name]               ACCOUNT NO.:  [id number]   MEDICAL RECORD NO.:  [id number]          PATIENT TYPE:  AMB   LOCATION:  DAY                           FACILITY:  [facility]   PHYSICIAN:  Kassie Mends, M.D.      DATE OF BIRTH:  [birth year]   DATE OF PROCEDURE:  04/22/2007  DATE OF DISCHARGE:                               OPERATIVE REPORT   PROCEDURE:  Esophagogastroduodenoscopy with Savary dilation.   INDICATION FOR EXAM:  [name] is an 77 year old female who presents  with a normocytic anemia with a hemoglobin of 11 and an MCV of 84.2.  Her ferritin is 35.  She complains of solid and liquid dysphagia for the  last month.  She also complains of gastroesophageal reflux disease and a  20-pound weight loss.  Her swallowing can get so bad that she needs to  drink water behind solid food.  She takes iron 325 mg daily.  She also  takes aspirin.   FINDINGS:  1. Sigmoid esophagus.  Distal esophageal web.  Patent Schatzki ring.      Esophagus dilated to 16 mm.  2. Moderate-sized hiatal hernia.  Otherwise normal stomach.  3. Normal duodenal bulb and second portion of the duodenum.  Normal      ampulla.   DIAGNOSIS:  [name] appeared to have a distal esophageal web.  She is  status post Savary dilation.  No source for her normocytic anemia was  identified.   RECOMMENDATIONS:  1. She should continue her pantoprazole daily.  2. She should follow up in 4 weeks with Dr. Cira Servant regarding her      dysphagia and her anemia.  3. No aspirin, NSAIDs or anticoagulation for 7 days.  4. She may resume her previous diet.   MEDICATIONS:  1. Demerol 50 mg IV.  2. Versed 2 mg IV.   PROCEDURE TECHNIQUE:  Physical exam was performed.  Informed consent was  obtained from the patient after explaining the benefits, risks and  alternatives to the procedure.  The patient was connected to the monitor  and placed in the left lateral position.  Continuous oxygen was provided  by nasal cannula and IV  medicine administered through an indwelling  cannula.  After administration of sedation, the patient's esophagus was  intubated and the scope was advanced under direct visualization to the  second portion of the duodenum.  The scope was removed  slowly by carefully examining the color, texture, anatomy and integrity  of the mucosa on the way out.  Scope advanced into the antrum and  guidewire inserted. Scope with drawn. Savary dilator passed over the  wire and removed. The patient was recovered in endoscopy and discharged  home in satisfactory condition.      Kassie Mends, M.D.  Electronically Signed     SM/MEDQ  D:  04/22/2007  T:  04/22/2007  Job:  [id number]   cc:   Doreen Beam, MD  Fax: [phone]

## 2010-09-12 NOTE — Assessment & Plan Note (Signed)
[facility]                          [facility]   [name], [name]                      MRN:          [id number]  DATE:10/05/2008                            DOB:          04-Jul-1921    REFERRING PHYSICIAN:  Doreen Beam, MD   HISTORY OF PRESENT ILLNESS:  The patient is a 77 year old female with a  history of severe aortic stenosis, status post percutaneous balloon  valvuloplasty.  The patient's last echocardiogram performed at [facility]  showed a mean gradient of 40 mmHg with a calculated valve area of 0.67  cm2.  The patient is in NYHA class IIb.  She started complaining during  her last office visit on Sep 23, 2008 of exertional chest pain.  She was  given a prescription for Imdur and Lopressor was increased.  She does  have a history of normal coronary arteries previously documented by  catheterization.  She is status post percutaneous balloon valvuloplasty  of her aortic valve and followed by Dr. Regino Schultze.  The patient states that  she is doing better now.  She has not started her Imdur yet, but  increase in metoprolol seem to have her improved her situation.  She is  not short of breath at rest.  She denies any further chest pain.  She  also has no palpitations.   There is no reports of presyncope or syncope.   MEDICATIONS:  1. Cilostazol 100 mg p.o. b.i.d.  2. Catapres TTS patch daily week.  3. Centrum Silver daily.  4. Metoprolol tartrate 50 mg p.o. b.i.d.  5. Spiriva.  6. Advair 250/50 b.i.d.  7. Aspirin 81 mg p.o. daily.  8. Iron 65 mg daily.  9. Protonix 40 mg p.o. b.i.d.  10.Calcium and vitamin D.  11.Atacand 32 mg half a tablet p.o. b.i.d.  12.Nebulizer q.i.d.  13.Norvasc 5 mg p.o. daily.   Laboratory work was reviewed.  The patient fecal occult blood screen was  negative x3.  Creatinine was 1.2 improvement from 1.3 on Aug 29, 2008.  Glucose was 104, BUN 22.  Sodium was 135, potassium 4.6.  Hemoglobin was  9.8,  hematocrit 29.8 with MCV of 85 and an RDW of 14.8 at the latter  slightly elevated white count is 3.6, ferratin is 20.  Based on the  study results, I suspect the patient has anemia of chronic disease,  although there could be a small component of iron deficiency involved.   PHYSICAL EXAMINATION:  VITAL SIGNS:  Blood pressure 142/65, heart rate  69, weight 124 pounds.  NECK:  Pulsus parvus et tardus bilaterally with soft bilateral bruits.  No thyromegaly.  No nodular thyroid.  LUNGS:  Clear breath sounds bilaterally.  HEART:  Regular rate and rhythm with a 4/6 crescendo-decrescendo murmur  late peaking with a soft second heart sound.  No definite S3.  ABDOMEN:  Soft, nontender.  No rebound or guarding.  Good bowel sounds.  EXTREMITIES:  No cyanosis, clubbing, or edema.   PROBLEM LIST:  1. Status post chest pain resolved.      a.     Normal coronary  arteries by previous catheterization.      b.     Resolved post increase of beta-blocker.      c.     Severe aortic stenosis (stable).      d.     Status post percutaneous balloon valvuloplasty by Dr. Regino Schultze,       May 2009 at [facility].  2. Normal left ventricular function.  3. Ventricular ectopy, improved.  4. Normocytic anemia secondary to anemia of chronic disease, possibly      with mild component of iron deficiency.  5. Mild renal insufficiency.  6. Carotid bruits.      a.     Negative carotid Dopplers May 2009.  7. Chronic diastolic heart failure, euvolemic.  8. Chronic obstructive pulmonary disease/emphysema.  9. Hypertension.  10.History of stroke.  11.Peripheral arterial disease.   PLAN:  1. The patient continues to do well after balloon valvuloplasty.  I do      not think she has worsening symptoms.  2. She has not started her Imdur yet and she will do that today.  She      does appear to be improved on the increase of her dose of      Lopressor.  3. The patient has a followup with Dr. Regino Schultze next  week.  4. We will follow the patient's CBC closely as we like to keep her      hemoglobin around 10.  I did increase her iron from 65-325 mg p.o.      daily.  We will follow up with a CBC in 6 weeks.     Learta Codding, MD,FACC  Electronically Signed    GED/MedQ  DD: 10/05/2008  DT: 10/06/2008  Job #: [id number]   cc:   Nolon Rod, M.D.  Doreen Beam, MD

## 2010-09-12 NOTE — Assessment & Plan Note (Signed)
[facility]                          [facility]   [name], [name]                      MRN:          [id number]  DATE:10/27/2007                            DOB:          [birth year]    REFERRING PHYSICIAN:  Doreen Beam, MD   HISTORY OF PRESENT ILLNESS:  The patient is an 77 year old female with a  history of symptomatic severe aortic stenosis.  The patient is status  post balloon aortic valvuloplasty with significant improvement in her  symptomatology.  The patient has been doing well.  She reports no  substernal chest pain.  Her exercise tolerance has improved.  She denies  any palpitations or syncope.  She does have difficulty with blood  pressure control and blood pressure is 186/76 in the office today.   MEDICATIONS:  1. Arava.  2. Albuterol.  3. Advair.  4. Aspirin 81 mg p.o. daily.  5. Iron 65 mg p.o. daily.  6. Multivitamin.  7. Protonix 40 mg p.o. b.i.d.  8. Calcium.  9. Atacand 32 mg half-tablet p.o. b.i.d.  10.Nebulizer.  11.Bumex 0.5 mg p.o. every other day.   PHYSICAL EXAMINATION:  VITAL SIGNS:  Blood pressure 186/76, heart rate  84, and weight is 118 pounds.  NECK: Normal carotid upstrokes.  No carotid bruits.  LUNGS:  Clear with breath sounds bilaterally.  HEART:  Regular rate and rhythm with 2/6 systolic murmur at the left  upper sternal border.  Normal S1 and S2.  No murmur, rubs, or gallops.  ABDOMEN:  Soft and nontender.  No rebound or guarding.  Good bowel  sounds.  EXTREMITIES: No cyanosis, clubbing, or edema.  NEURO:  The patient is alert, oriented, and grossly nonfocal.   PROBLEMS:  1. Status post balloon valvuloplasty for his medical symptomatic      aortic stenosis.  2. Normal coronary arteries by catheterization.  3. Chronic diastolic heart failure.  Currently. Euvolemic.  4. Allergies to ACE inhibitors.  5. Hypertension, uncontrolled.   PLAN:  1. We will add Norvasc to the patient's  medical regimen 5 mg p.o.      daily.  2. From a symptomatic standpoint, the patient has improved      significantly post balloon valvuloplasty.  I will continue to      monitor and will see her back in 3 months.     Learta Codding, MD,FACC  Electronically Signed    GED/MedQ  DD: 10/27/2007  DT: 10/28/2007  Job #: [id number]   cc:   Doreen Beam, MD

## 2010-09-12 NOTE — Assessment & Plan Note (Signed)
[facility]                          [facility]   [name], [name]                      MRN:          [id number]  DATE:09/23/2008                            DOB:          1921-09-19    PRIMARY CARDIOLOGIST:  Learta Codding, MD,FACC   REASON FOR VISIT:  Scheduled followup.   Please refer to Dr. Margarita Mail recent note of June 24, 2008, for  complete details.   Since that time, [name] did contact our office complaining of  frequent palpitations.  She was placed on a 24-hour Holter monitor,  reviewed by me, which indicated frequent PVCs and ventricular bigeminy.  There was no evidence of nonsustained ventricular tachycardia.   The patient then was seen briefly in the emergency room here on Aug 29, 2008, again with complaint of palpitations.  Routine blood work was  drawn, and notable only for a hemoglobin of 9.1 with an MCV of 81.   Clinically, [name] reports 3 isolated instances of chest pain, for  which she took 1 nitroglycerin tablet with prompt relief.  She has not  had any chest discomfort since April.  She otherwise is feeling quite  well and much less short of breath with exertion, than before undergoing  a balloon valvuloplasty of her severe aortic stenosis, in May of last  year, at [facility].   CURRENT MEDICATIONS:  1. Metoprolol tartrate 25 mg b.i.d.  2. Cilostazol 100 mg b.i.d.  3. Catapres patch TTS 2 q.weekly.  4. Spiriva.  5. Advair.  6. Aspirin 81 daily.  7. Iron 65 mg daily.  8. Protonix 40 b.i.d.  9. Atacand 16 mg b.i.d.  10.Nebulizer q.i.d.  11.Norvasc 5 daily.   PHYSICAL EXAMINATION:  VITAL SIGNS:  Blood pressure is 145/65, pulse is  83, regular, and weight is 124.  GENERAL:  An 77 year old female sitting upright in no distress.  HEENT:  Normocephalic, atraumatic.  NECK:  Left carotid bruit.  No JVD.  LUNGS:  Diminished breath sounds, without crackles or wheezes.  HEART:  Regular rate and rhythm.   A 2- 3/6 systolic ejection murmur at  the base.  No diastolic blow.  ABDOMEN:  Soft, nontender.  EXTREMITIES:  Trace edema.  NEUROLOGIC:  Alert and oriented.   IMPRESSION:  1. Chest pain.      a.     Relieved by nitroglycerin.      b.     History of normal coronary arteries.  2. Severe aortic stenosis.      a.     Status post percutaneous balloon valvuloplasty, May 2009       [facility]).  3. Normal left ventricular function.  4. Frequent ventricular ectopy.  5. Normocytic anemia.      a.     Secondary to anemia of chronic disease.  6. Mild renal insufficiency.  7. Carotid bruits.      a.     Negative carotid Dopplers, May 2009.  8. Chronic diastolic heart failure, euvolemic.  9. COPD/emphysema.  10.Hypertension.  11.History of stroke.  12.Peripheral arterial disease.   PLAN:  1. Uptitrate  Lopressor to 50 mg b.i.d., for suppression of ventricular      ectopy.  2. Add Imdur 30 mg daily, for presumed anginal pain.  3. Surveillance blood work with CMET, CBC, and TSH level.  We will      also reassess iron levels with an iron profile and ferritin level,      and guaiac stools.  If she has worsening anemia, we will consider      transfusion as well as reevaluation by her gastroenterologist.  She      was seen by Dr. Cira Servant in July of last year.  4. Schedule early clinic follow up with myself and Dr. Andee Lineman in 2      weeks.      Gene Serpe, PA-C  Electronically Signed      Learta Codding, MD,FACC  Electronically Signed   GS/MedQ  DD: 09/23/2008  DT: 09/24/2008  Job #: [id number]   cc:   Doreen Beam, MD

## 2010-09-12 NOTE — Assessment & Plan Note (Signed)
[facility]                          [facility]   [name], [name]                      MRN:          [id number]  DATE:06/17/2007                            DOB:          19-Oct-1921    REASON FOR VISIT:  Six month followup.   HISTORY OF PRESENT ILLNESS:  [name] is an 77 year old female patient  with a history of severe aortic stenosis but normal coronary arteries by  catheterization in 2006.  She returns to the office today for routine  followup.  Her last echocardiogram was performed in August of 2009.  This revealed severe aortic stenosis.  Her velocity through the outflow  tract was 4 meters per second.  She has normal LV function.   In the office today she notes that she is doing well.  She did recently  go to the emergency room with complaints of abdominal pain.  She notes  chest pain with this as well as shortness of breath with exertion.  She  was apparently diagnosed with constipation and was placed on stool  softeners and laxatives.  Since her constipation has been relieved her  shortness of breath and chest discomfort have resolved.  She does  describe baseline dyspnea with exertion.  She describes NYHA class II-  III symptoms.  There overall has been no change.  She denies any chest  pain now.  She denies any syncope or near syncope.  She denies  orthopnea, PND or pedal edema.  She also recently saw Dr. Jena Gauss who did  an endoscopy.  She was apparently diagnosed with a hiatal hernia.  Her  Protonix was increased to twice daily dosing.  She has history of  microcytic anemia.  She has a low normal ferritin level.  She has been  placed on iron.  Her last hemoglobin February 10 was 10.5, her MCV was  77.9.  Of note, on February 10 her urinalysis revealed no evidence of  bilirubin.   CURRENT MEDICATIONS:  1. Aspirin 81 mg daily.  2. Spiriva 18 mcg daily.  3. Advair 250/50 b.i.d.  4. Albuterol four times a day.  5.  Caltrate.  6. Centrum Silver.  7. Ferrous sulfate 325 mg b.i.d.  8. Atacand 16 mg daily.  9. Protonix 40 mg b.i.d.  10.Bumetanide 0.5 mg p.r.n.   ALLERGIES:  ACE INHIBITORS.   PHYSICAL EXAMINATION:  GENERAL:  She is a well-nourished, well-developed  female in no acute distress.  VITAL SIGNS:  Blood pressure is 149/73, pulse 99, weight 124.6 pounds.  Repeat blood pressure by me is 164/66 on the left, 170/68 on the right.  HEENT:  Normal.  NECK:  Without JVD.  CARDIAC:  Normal S1, diminished S2.  Regular rate and rhythm, 2-3/6  systolic ejection murmur with a crescendo-decrescendo pattern heard best  at the right sternal border.  LUNGS:  Lungs with decreased breath sounds bilaterally, dry crackles, no  rales.  ABDOMEN:  Soft, nontender.  EXTREMITIES:  Without edema.  NEUROLOGIC:  She is alert and oriented x3.  Cranial nerves II-XII  grossly intact.   Electrocardiogram reveals sinus  rhythm with a heart rate of 86, normal  axis, no acute changes.   IMPRESSION:  1. Severe aortic stenosis.  2. Good LV function.  3. Bilateral carotid artery bruits.      a.     Noncritical carotid artery stenosis (zero to 39% by Dopplers       January 2008).  4. Normal coronary arteries by catheterization July 2006.  5. Chronic obstructive pulmonary disease.  6. Hypertension.  7. History of left brain CVA.  8. Peripheral arterial disease.      a.     ABIs 1.1 on the right, 0.59 on the left July 2006.  9. Microcytic anemia.  10.Recent history of constipation.  11.Gastroesophageal reflux disease.      a.     Hiatal hernia.   DISCUSSION:  [name] presents for routine followup.  She did have some  symptoms of shortness of breath and chest and abdominal discomfort  recently.  Since being treated for constipation the symptoms have  subsided.  She also saw Dr. Jena Gauss recently who performed an endoscopy.  She was apparently diagnosed with a hiatal hernia.  Colonoscopy was not  performed.  She has  been worked up for microcytic anemia and is  currently on iron therapy.  Overall she is still fairly asymptomatic  with her aortic stenosis.   PLAN:  1. Proceed with repeat echocardiogram to reassess her aortic valve.  2. Will check an LDH and haptoglobin to rule out the possibility of      hemolysis across her aortic valve contributing to some of her      anemia.  3. She will be brought back in followup in the next 3 months.  She      knows to contact us or return sooner if she has any change in her      symptoms.  4. Her blood pressure will be left somewhat elevated given her history      of severe aortic stenosis.      Tereso Newcomer, PA-C  Electronically Signed      Learta Codding, MD,FACC  Electronically Signed   SW/MedQ  DD: 06/17/2007  DT: 06/18/2007  Job #: [id number]   cc:   Doreen Beam, MD

## 2010-09-12 NOTE — Assessment & Plan Note (Signed)
[facility]                          [facility]   [name], [name]                      MRN:          [id number]  DATE:03/10/2008                            DOB:          08-10-1921    PRIMARY CARDIOLOGIST:  Learta Codding, MD, [facility].   REASON FOR VISIT:  Scheduled followup.   [name] continues to do quite well since undergoing a balloon  valvuloplasty for treatment of severe aortic stenosis.  She has had  subsequent postoperative followup with Dr. Donalee Citrin at [facility].  She  reports that he most recently placed her on Pletal, for treatment of  peripheral arterial disease, following some recent abnormal ABIs.   She denies any shortness of breath.  She has only some mild symptoms  upon climbing a flight of stairs.  She denies any exertional chest  discomfort.   She refers to feeling her heart beat, but denies any tachypalpitations  or fluttering sensation.  She also reports adequate control of her  blood pressure with home readings.   [name] reports having been recently diagnosed with a hiatal hernia.   CURRENT MEDICATIONS:  1. Norvasc 5 daily.  2. Cilostazol 100 b.i.d.  3. Albuterol nebulizer q.i.d.  4. Atacand 16 b.i.d.  5. Protonix b.i.d.  6. Clonidine TTS 1 q. weekly.  7. Aspirin 81 daily.  8. Iron 65 daily.  9. Advair b.i.d.  10.Spiriva.  11.Albuterol MDI p.r.n.   PHYSICAL EXAMINATION:  VITAL SIGNS:  Blood pressure 147/74, pulse 109  and regular, and weight 126 (up 8).  GENERAL:  An 77 year old female sitting upright, in no distress.  HEENT:  Normocephalic and atraumatic.  NECK:  Palpable carotid pulses with bilateral neck and supraclavicular  bruits.  Brisk bilateral radial pulses.  LUNGS:  Diminished breath sounds, but without crackles or wheezes.  HEART:  Regular rate and rhythm.  A 2-3/6 crescendo-decrescendo murmur  at the base.  No diastolic blow.  No rubs.  ABDOMEN:  Soft and intact bowel sounds.  EXTREMITIES:  Trace pedal edema.  NEUROLOGIC:  No focal deficit.   IMPRESSION:  1. Severe aortic stenosis.      a.     Status post balloon aortic valvuloplasty, May 2009 [facility]).  2. Chronic diastolic heart failure.  3. Atypical chest pain.      a.     Normal coronary arteries by coronary angiography, May 2009       [facility]).  4. Hypertension.  5. Peripheral arterial disease.  6. Noncritical cerebrovascular disease.  7. Chronic obstructive pulmonary disease/emphysema.   PLAN:  1. Surveillance 2D echocardiogram in the next month, for continued      close monitoring of her aortic stenosis status post balloon      valvuloplasty.  2. Start Lopressor 25 mg b.i.d. for better basal heart rate control.  3. Schedule clinic followup for blood pressure/pulse check in 2 weeks.  4. Return clinic followup with myself and Dr. Andee Lineman in 4 months.      Gene Serpe, PA-C  Electronically Signed      Learta Codding, MD,FACC  Electronically  Signed   GS/MedQ  DD: 03/10/2008  DT: 03/11/2008  Job #: [id number]

## 2010-09-12 NOTE — Assessment & Plan Note (Signed)
NAMEMarland Kitchen  RAEVEN, PINT                CHART#:  [id number]   DATE:  11/11/2007                       DOB:  December 04, 1921   REFERRING PHYSICIAN:  Dr. Sherril Croon.   PRIMARY CARDIOLOGIST:  Learta Codding, MD,FACC   PROBLEMS:  1. Normocytic anemia secondary to anemia of chronic disease.  2. Distal esophageal web dilated to 16 mm in December 2008.  3. Hiatal hernia.  4. Hypertension.  5. Asthma.   SUBJECTIVE:  Ms. Woolridge is an 77 year old female who presents as return  patient visit.  She was last seen in April 2008, was complaining of food  crawling up her esophagus.  She was asked to continue the Protonix daily  and a barium esophagram was obtained.  Her barium esophagram showed a  small cervical diverticulum.  Since her last visit with [place], she has had  a heart cardiac catheterization.  She is still on Protonix once a day.  She is not on any Plavix or Coumadin.  She has not too much problems  with swallowing.  She denies any heartburn or indigestion as long she  watches what she eats.   MEDICATIONS:  1. Atacand 60 mg b.i.d.  2. Protonix 40 mg daily.  3. Aspirin 81 mg daily.  4. Spiriva.  5. Advair.  6. Centrum Silver.  7. Calcium.  8. Iron 325 mg daily.  9. Unknown cardiac medication.  10.Albuterol.   OBJECTIVE:  VITALS:  Weight 121 pounds (down 4 pounds since April 2009).  Height 5 feet 1 inch, BMI 22.9 (healthy), temperature 97.8, blood  pressure 142/78, and pulse 18. GENERAL:  She is in no apparent distress.  Alert and orient x4. LUNGS:  Clear to auscultation bilaterally.  CARDIOVASCULAR:  Regular rhythm. ABDOMEN:  Bowel sounds are present,  soft, nontender, and nondistended.   ASSESSMENT:  Ms. Newgent is an 77 year old female who continues to  complain that her teeth do not fit and so she does not eat.  She  continues on iron supplementation once a day.  She denies having any  evidence of bleeding.  Thank you for allowing me to see the patient in  consultation.  My recommendations  follow.   RECOMMENDATIONS:  1. Will obtain recent procedure and lab assessment from [facility].  2. She has a follow up appointment to see me in 6 months.       Kassie Mends, M.D.  Electronically Signed     SM/MEDQ  D:  11/11/2007  T:  11/12/2007  Job:  [id number]   cc:   Doreen Beam, MD

## 2010-09-12 NOTE — Assessment & Plan Note (Signed)
NAME:  LILEE, ALDEA                CHART#:  [id number]   DATE:  08/06/2007                       DOB:  1921-05-14   SUBJECTIVE:  Ms. Etcheverry is an 77 year old female with history of  normocytic anemia, thought to be anemia of chronic disease.  There is no  evidence of iron deficiency.  She has a history of solid and liquid food  dysphagia and distal esophageal web with elevation to 16 mm in December  2008.  She also has a hiatal hernia.  She returns for followup today.  She is complaining of regurgitation.  She notes that when she eats her  food, she feels as though it rolls around in her esophagus.  It does not  seem to get stuck.  She denies dysphagia at this point or odynophagia  since she has had dilatation. However, she feels as though her food is  crawling up her esophagus after she eats within minutes.  She does have  some nausea.  Denies any vomiting.  Denies any anorexia or early  satiety.  She is on Protonix 40 mg daily.  She is being evaluated by  cardiology with possibly some cardiac surgery in the near future.  Her  weight is down about 3 pounds since she was last seen in the last 2  months.  Current medications in the list from August 06, 2007.   ALLERGIES:  ACE INHIBITORS.   OBJECTIVE:  VITAL SIGNS:  Weight 125, height 61 inches, temperature  97.8, blood pressure 140/70, pulse 84.  GENERAL:  She is a thin African-American female in no acute distress.  who is awake and alert, oriented, pleasant, and cooperative, in no acute  distress.  HEENT:  Pupils equal, round, and reactive to light.  Sclerae  clear, nonicteric.  Conjunctivae pink.  Oropharynx pink and moist  without lesions.CHEST:  Heart regular rate and rhythm.  Normal S1, S2  without murmurs, clicks, rubs, or gallops.  ABDOMEN:  Positive bowel  sounds x4.  No bruits auscultated.  Soft.  Nontender, nondistended  without palpable mass or hepatosplenomegaly.  No rebound tenderness or  guarding.  EXTREMITIES:  Without  clubbing or edema bilaterally.   ASSESSMENT:  1. Ms. Liggett is an 77 year old female with history of esophageal      dysphagia which has resolved status post dilatation of esophageal      web last year.  She continues to have a sensation of regurgitation      and her food crawling up her esophagus after she eats.  She does      have hiatal hernia which could be the culprit but I am wondering if      she does not have a motility disorder.  2. She has a anemia of chronic disease.   PLAN:  1. Continue Protonix 40 mg daily.  2. A barium pill esophagram.  3. She is having some problems with her recent change in blood      pressure pills and I have asked her to follow up with Dr. Sherril Croon      regarding this as soon as possible.  4. We will be calling her with a barium pill esophagram results and      going from there.   ADDENDUM:  BE: sml cervical diverticulum, sml HH, no motility disorder  Lorenza Burton, N.P.  Electronically Signed     Kassie Mends, M.D.  Electronically Signed    KJ/MEDQ  D:  08/06/2007  T:  08/06/2007  Job:  [id number]   cc:   Doreen Beam, MD

## 2010-09-12 NOTE — Assessment & Plan Note (Signed)
[facility]                          [facility] OFF[facility] NOTE   TAILOR, LUCKING                      MRN:          [id number]  DATE:08/14/2007                            DOB:          1921/06/23    REFERRING PHYSICIAN:  Doreen Beam, MD   HISTORY OF PRESENT ILLNESS:  The patient is an 77 year old female with  severe aortic stenosis with calculated valve at 0.7 cm2.  The patient  normal coronary arteries by catheterization 2006.  The patient now  reports dyspnea on exertion.  She states she can only walk the length of  [employer] and then she becomes short of breath, she also states that just  a little house chores makes her give out.  The patient had normal LV  function on echo, but has a velocity of 4 meters per second.  She came  today per my request to discuss the options with her family.   MEDICATIONS:  Atacand 32 mg per day, Spiriva, bumetanide 0.5 mg 1-2  tablets a day, Protonix 40 was a day, albuterol, Advair, aspirin 81 mg a  day, iron 65 mg a day, multivitamin, calcium and clonidine patch 0.1 TTS  patch q. weekly.   PHYSICAL EXAMINATION:  VITAL SIGNS:  Blood pressure is 138/69, heart  rate 68, weights 123 pounds.  Neck exam normal carotid stroke and no carotid bruits.  LUNGS:  Clear breath sounds bilaterally.  HEART:  Regular rate and rhythm with normal S1-S2.  There is a 2-3/6  crescendo-decrescendo murmur at the left upper sternal border.  ABDOMEN:  Soft, nontender.  No rebound or guarding.  Good bowel sounds.  Extremity exam no cyanosis, clubbing or edema.   PROBLEM LIST:  1. Severe aortic stenosis.  2. Good LV function.  3. Bilateral carotid bruits but with noncritical disease.  4. Normal coronary arteries by catheterization July 2006.  5. COPD.  6. Hypertension.  7. Left brain CVA resolved.  8. Peripheral arterial disease (ABI 1.1 right 0.59 left).  9. Microcytic anemia, stable.  10.GERD.   PLAN:  1. The patient  presented difficult situation.  She clearly has severe      aortic stenosis and her symptoms could also be consistent with      worsening valve disease.  However, she is 77 years old and      increased risk for valve replacement.  I am also not completely      convinced that her fatigue and weakness is entirely explained by      aortic stenosis.  2. Gave the patient the option to followed carefully aortic stenosis      with every 3 to 6 month echocardiograms an evaluation for a      worsening symptoms versus second opinion with Dr. Modesto Charon at [facility].  She has opted for the latter.  3. The patient will follow up with [place] after referral to Dr. Modesto Charon      regarding decision for valve replacement.Learta Codding, MD,FACC  Electronically Signed    GED/MedQ  DD: 08/14/2007  DT: 08/14/2007  Job #: [id number]   cc:   Doreen Beam, MD

## 2010-09-12 NOTE — Assessment & Plan Note (Signed)
[facility]                          [facility]ICE NOTE   CRISELDA, STARKE                      MRN:          [id number]  DATE:12/19/2006                            DOB:          09-09-1921    HISTORY OF PRESENT ILLNESS:  The patient is an 77 year old female with a  history of severe aortic stenosis but normal coronary arteries and  normal LV function.  The patient has been doing well and has been  followed carefully in regards to her aortic stenosis.  The patient is  asymptomatic at rest.  She denies any shortness of breath or chest pain.  She has dyspnea on exertion which is rather stable.  She also has a  history of chronic obstructive pulmonary disease.  The patient states  that she is able to walk a good distance without any significant  difficulty.  She does have significant hypertension.  She is on  antihypertensive treatment.   MEDICATIONS:  1. Aspirin 81 mg per day.  2. Spiriva 80 mcg p.o. daily.  3. Advair 250/50.  4. Protonix 40 mg p.o. daily.  5. Caltrate.  6. Centrum Silver.  7. Ferrous sulfate.  8. Atacand 16 mg p.o. daily.  9. Bumetanide 0.5 mg every other day.   PHYSICAL EXAMINATION:  VITAL SIGNS:  Blood pressure is 160/84, heart  rate 76 beats per minute, weight is 127 pounds.  NECK:  Diminished carotid upstrokes bilaterally but __________.  LUNGS:  Clear breath sounds bilaterally.  HEART:  Regular rate and rhythm with a 3-4/6 crescendo/decrescendo late-  peaking murmur at the aortic point.  The second heart sound is difficult  to auscultate.  ABDOMEN:  Soft, nontender, no rebound or guarding.  Good bowel sounds.  EXTREMITIES:  No cyanosis, clubbing, or edema.  NEUROLOGIC:  The patient is alert, oriented, and grossly nonfocal.  There is some mild residual weakness on the right side.   PROBLEM LIST:  1. Severe aortic stenosis.      a.     Valve area 0.9-cm2.      b.     Mean gradient 40-mmHg by  echocardiogram.  2. Preserved left ventricular function.  3. Bilateral carotid bruits.      a.     Noncritical bilateral internal carotid artery disease.  4. Normal coronary arteries by catheterization in July 2006.  5. Chronic obstructive pulmonary disease.  6. Hypertension.  7. History of stroke with very mild residual right hemiparesis.  8. Peripheral vascular disease with lower extremity Doppler studies,      July 2006.  ABI 1.1 on the right and 0.59 on the left.  9. Hoarseness.   PLAN:  1. I have told the patient that I am somewhat concerned about the      hoarseness, although this is relatively acute.  She could have      bronchitis.  I have told the patient to monitor this very carefully      and to talk with Dr. Katrinka Blazing about this if it requires any further      followup and workup.  2. The patient's  EKG in the office looks within normal limits.  Her      symptoms have also not changed in regard to her dyspnea.  It is not      entirely clear if this is related to symptomatic aortic stenosis.      We will proceed with a repeat 2D echocardiographic study and if the      valve area is significantly worsened, we should likely consider      aortic valve replacement in this patient.  We will continue to      closely monitor.  3. The patient will follow up with [place] in 6 months, otherwise after      review of her echocardiographic study.     Learta Codding, MD,FACC  Electronically Signed    GED/MedQ  DD: 12/19/2006  DT: 12/20/2006  Job #: [id number]

## 2010-09-12 NOTE — Assessment & Plan Note (Signed)
[facility]                          [facility]ICE NOTE   [name], [name]                      MRN:          [id number]  DATE:06/24/2008                            DOB:          08/26/1921    REFERRING PHYSICIAN:  Doreen Beam, MD   HISTORY OF PRESENT ILLNESS:  The patient is a very pleasant 77 year old  female with a history of balloon valvuloplasty secondary to severe  aortic stenosis.  She has done well in the meanwhile.  She is able to  get around her daily chores.  She denies any chest pain or shortness of  breath on exertion.  She denies any orthopnea or PND.  She does state  that sometimes she has numbness and some pain in the left arm.   MEDICATIONS:  1. Metoprolol tartrate 50 mg half-tablet p.o. b.i.d.  2. Centrum.  3. Cartia 1 tablet p.o. b.i.d.  4. Cilostazol 100 mg p.o. b.i.d.  5. Advair 25/50.  6. Spiriva daily.  7. Aspirin 81 mg a day.  8. Iron 75 mg p.o. daily.  9. Multivitamin daily.  10.Clonidine patch 0.1 mg every week.  11.Protonix.  12.Calcium.  13.Atacand 32 mg half-tablet p.o. b.i.d.  14.Nebulizer q.i.d.  15.Norvasc 5 mg p.o. daily.   PHYSICAL EXAMINATION:  VITAL SIGNS:  Blood pressure is 142/68, heart  rate is 84, weight is 124 pounds.  NECK:  Normal carotid upstroke.  No carotid bruits.  LUNGS:  Clear breath sounds bilaterally.  HEART:  Regular rate and rhythm with a 3/6 crescendo-decrescendo murmur  to the left and right upper sternal border.  ABDOMEN:  Soft.  EXTREMITIES:  No cyanosis, clubbing, or edema.   PROBLEM LIST:  1. Severe aortic stenosis, status post percutaneous balloon      valvuloplasty in May 2009.  2. Peripheral vascular disease.  3. Diabetes.  4. Asthma.  5. Hypertension.  6. History of gastroenteritis.  7. Chronic diastolic heart failure.  8. Atypical chest pain.  9. Chronic obstructive pulmonary disease.   PLAN:  1. The patient does describe some pain in the left arm and I  have      given a prescription for sublingual nitroglycerin.  It is somewhat      atypical, but certainly could represent angina.  2. The patient has significant coronary artery disease by      catheterization previously.     Learta Codding, MD,FACC     GED/MedQ  DD: 06/24/2008  DT: 06/25/2008  Job #: [id number]   cc:   Doreen Beam, MD

## 2010-09-12 NOTE — Consult Note (Signed)
NAMEJENESE, [name]               ACCOUNT NO.:  [id number]   MEDICAL RECORD NO.:  [id number]         PATIENT TYPE:  AMB   LOCATION:  DAY                           FACILITY:  [facility]   PHYSICIAN:  Kassie Mends, M.D.      DATE OF BIRTH:  [birth year]   DATE OF CONSULTATION:  DATE OF DISCHARGE:                                 CONSULTATION   REQUESTING PHYSICIAN:  Dr. Sherril Croon   REASON FOR CONSULTATION:  Microcytic anemia/GERD/dysphagia.   HISTORY OF PRESENT ILLNESS:  [name] is an 77 year old female.  She  has history of chronic anemia per her report.  Laboratory studies  through Dr. Sherril Croon' office show a microcytic anemia with hemoglobin of  10.9, hematocrit of 33.4, MCV of 79.1.  Notably, she also has a white  blood cell count of 3.8, platelet count is normal.  She tells me she was  diagnosed with anemia initially in 2006.  She did require a couple of  transfusions in 2006 and 2007 but she does not have details regarding  this.  She has been on iron for about 2 years now, too.  She complains  of dysphagia and frequent hoarseness.  She has been treated for GERD.  She is currently on pantoprazole 40 mg daily.  She complains of solid  foods getting stuck in her upper esophagus.  She is having to drink  water to pass the food bolus.  She tells me she also occasionally chokes  with liquids.  It sounds like she has had an evaluation by speech  pathology with a history of possible aspiration but again, the details  are obscure.  She lost about 20 pounds in the last year.  She has gained  back about eight.  She tells me she has had anorexia for at least a year  now.  She is having normal bowel movements.  Denies any rectal bleeding  or melena.  She notes dark stools since she started the iron.  She  denies any complaints of nausea or vomiting.  Denies any abdominal pain  at this time.   PAST MEDICAL AND SURGICAL HISTORY:  She has severe aortic stenosis with  a preserved LV function.  She has  peripheral vascular disease.  She has  CHF.  She has cataracts.  She has a history of asthma, COPD, arthritis.  She had a pulmonary embolus in 1997.  She had a CVA, diabetes mellitus  and chronic GERD.  She had a colonoscopy in 1990 and she gives vague  history of GI bleeding.  Unfortunately, I do not have these reports.  She has required transfusions in the past, she believes in 2006 and  2007, and thinks she may have even had a colonoscopy and EGD at that  time but I was unable to locate the records.   CURRENT MEDICATIONS:  1. Atacand 16 mg daily.  2. Pantoprazole 40 mg daily.  3. Bumetanide 0.5 mg every-other day.  4. Aspirin 81 mg daily.  5. Spiriva once daily.  6. Advair Diskus b.i.d.  7. Centrum Silver daily.  8. Calcium 600 mg with  vitamin D b.i.d.  9. Iron 325 mg daily.   ALLERGIES:  ACE INHIBITORS cause angioedema.   FAMILY HISTORY:  There is no known family history of colorectal  carcinoma, liver or chronic GI problems.  Mother deceased at 51  secondary to leukemia.  Father deceased at 52 secondary to MI.  Two  sisters, one died of unknown etiology, the other from COPD.  One alive  with heart problems.  Two brothers, one deceased with CVA and one with  renal failure.   SOCIAL HISTORY:  [name] has been separated from her husband for  years.  She lives alone.  She has six healthy children.  She is retired  from [employer], [employer].  She denies any tobacco, alcohol or drug use.   REVIEW OF SYSTEMS:  See HPI.  CARDIOVASCULAR:  She denies any chest pain  or palpitations.  PULMONARY:  She does have chronic cough with very  little phlegm production.  Denies any hemoptysis or shortness of breath  currently.   PHYSICAL EXAMINATION:  VITAL SIGNS:  Weight 128 pounds, height 61  inches, temperature 98.3, blood pressure 160/78, pulse 92.  GENERAL:  She is an elderly female who is alert, oriented, pleasant and  cooperative in no acute distress  HEENT:  Sclerae clear, nonicteric.   Conjunctivae pink.  Oropharynx pink  and moist without any lesions.  NECK:  Supple without any mass or thyromegaly.  CHEST:  Heart regular rate and rhythm.  She has a 3/6 murmur noted.  LUNGS:  Clear to auscultation bilaterally.  ABDOMEN:  Positive bowel sounds x4.  No bruits auscultated.  Soft,  nontender, nondistended, without palpable mass or hepatosplenomegaly.  No rebound tenderness or guarding.  EXTREMITIES:  Without clubbing or edema bilaterally.  SKIN:  Warm and dry without any rash or jaundice.  RECTAL:  She does have a large external hemorrhoid at 7 o'clock that is  not actively thrombosed or bleeding.  Internal exam reveals no masses.  A small amount of light brown stool was obtained from the vault which is  Hemoccult negative.   LABORATORY STUDIES:  From Dr. Sherril Croon' office on February 13, 2007:  She had  a normal CMP except for a CO2 of 31 and a BUN of 20.   IMPRESSION:  [name] is an 77 year old female with a history of  chronic microcytic anemia.  CBC also shows mild leukocytosis.  Previous  workup as far as anemia and GI bleeding appears to have been done  elsewhere back in 2006.  Unfortunately, I am unable to find records  regarding this but will attempt to contact [facility] as  she may have presented there.  She is Hemoccult negative in the office  today.  Ultimately, we are going to need to verify that she has had a  recent colonoscopy to rule out colorectal carcinoma.  We also need to  check for GI bleeding and will obtain three Hemoccults.  She is going to  need further evaluation with EGD given her history of dysphagia and to  rule out occult malignancy.  I am unsure of her H. pylori status which  can also cause chronic anemia as well.   PLAN:  1. Will obtain a CBC and anemia panel to include iron, ferritin,      folate, B12 and TIBC.  2. Hemoccult stools x3.  3. She is going to need EGD with possible ED and colonoscopy if this      has not  been done  elsewhere recently with Dr. Cira Servant in the near      future.  Will decide exactly what she needs once our database is      complete.  4. Will request recent colonoscopy and EGD reports from [facility].  She is going to give [place] a call back if she      recalls which physician may have performed these procedures on her      previously.   We would like to thank Dr. Sherril Croon for allowing [place] to participate in the  care of Ms. Warren.      Lorenza Burton, N.P.      Kassie Mends, M.D.  Electronically Signed    KJ/MEDQ  D:  04/04/2007  T:  04/04/2007  Job:  [id number]   cc:   Doreen Beam  Fax: [phone]

## 2010-09-12 NOTE — Assessment & Plan Note (Signed)
NAMEMarland Kitchen  SAIA, DEROSSETT                CHART#:  [id number]   DATE:  05/22/2007                       DOB:  [birth year]   REFERRING PHYSICIAN:  Doreen Beam, MD   PROBLEM LIST:  1. Normocytic anemia.  2. Solid and liquid dysphagia secondary distal web with dilation to 16      mm in December 2008.  3. Hiatal hernia.  4. Hypertension.  5. Asthma.   OBJECTIVE:  Ms. Steier is an 77 year old female who presents as a return  patient visit.  She is still having difficulty with  her diet due to the  fact that she does not have dentures.  She says most of the food she  eats goes through her blender.  She eats some meat daily but if it is  chicken or fish, it must be chopped up in small pieces.  She denies any  nausea, vomiting or abdominal pain.  She really does not have an  appetite, but she forces herself to eat.  She likes to drink vinegar and  water.  She is up to three formed bowel movements a day.  She denies any  rectal bleeding or black, tarry stools.  Occasionally food hurts when it  goes through her esophagus.   OBJECTIVE:  Weight:  125 pounds (down 3 pounds since December 2008).  Height:  5 feet 1 inch.  Temperature:  97.5.  Blood pressure:  142/78.  Pulse:  88.  GENERAL:  She is in no apparent distress.  Alert and oriented x4. LUNGS:  Clear to auscultation bilaterally.CARDIOVASCULAR:  Regular rhythm with  no murmur.  ABDOMEN:  Bowel sounds are present.  Soft, nontender, nondistended.   ASSESSMENT:  Ms. Jeancharles is an 77 year old female who had a complete  colonoscopy in 2005.  It was done at the time for blood in her stool.  In October 2008 her creatinine was 1.10 and her hemoglobin was 10.9 with  an MCV of 79.1.  It was rechecked again in December 2008 and her  hemoglobin was 11 with an MCV of 84.2 with a ferritin of 35.  She was on  iron at the time.  She had three stools which were Hemoccult negative.  She has no evidence of active gastrointestinal bleeding at this point,  and her  swallowing difficulty has resolved.  Thank you for allowing me  to see Ms. Blea in consultation.  My recommendations follow.   RECOMMENDATIONS:  1. Will recheck Ms. Rodocker's BMP and CBC.  It appears that she has      anemia associated with chronic disease.  Her mild anemia may be      associated with her poor dentition and the lack      of adequate iron intake in her diet.  Will call Ms. Boyack with the      results of her labs.  2. She has a follow up appointment to see me in April 2008.       Kassie Mends, M.D.  Electronically Signed     SM/MEDQ  D:  05/22/2007  T:  05/22/2007  Job:  [id number]   cc:   Doreen Beam, MD

## 2010-09-15 NOTE — Cardiovascular Report (Signed)
NAMEYAQUELIN, LANGELIER NO.:  [id number]   MEDICAL RECORD NO.:  [id number]          PATIENT TYPE:  INP   LOCATION:  4736                         FACILITY:  [facility]   PHYSICIAN:  Rollene Rotunda, M.D.   DATE OF BIRTH:  [birth year]   DATE OF PROCEDURE:  11/09/2004  DATE OF DISCHARGE:                              CARDIAC CATHETERIZATION   PRIMARY CARE PHYSICIAN:  Forrest Moron.   PRIMARY CARDIOLOGIST:  Learta Codding, M.D.   PROCEDURE:  Left and right heart catheterization/ coronary arteriography.   INDICATIONS:  Evaluate patient with aortic stenosis and dyspnea.   PROCEDURE NOTE:  The left heart catheterization was performed via the right  femoral artery; right heart catheterization performed via the right femoral  vein.  Both vessels were cannulated using anterior wall puncture.  A 6-  [place] arterial sheath and 7-French venous sheath were inserted via the  modified Seldinger technique.  Preformed Judkins and pigtail catheter were  utilized.  The patient tolerated the procedure well and left the lab in  stable condition.   RESULTS:  1.  RA:  Mean 19.  2.  RV:  49/17.  3.  PA:  41/23 with a mean of 29.  4.  Pulmonary capillary wedge pressure:  Mean 19.  5.  AO:  128/79.  6.  LV:  158/17.  7.  Mean aortic valve gradient:  29.8.  8.  Aortic valve area:  0.78.  9.  Aortic valve index:  0.58.  10. Cardiac output/cardiac index:  (thermodilution) 6.2/3.95.  11. Cardiac output/cardiac index:  (FICK) 5.3/3.38.   CORONARIES:  1.  LEFT MAIN:  Normal.  2.  LEFT ANTERIOR DESCENDING ARTERY:  A long, tortuous vessel, wrapping the      apex.  It was normal.  There was a mid large diagonal which was normal.  3.  CIRCUMFLEX:  A dominant, large vessel.  It was normal throughout its AV      course.  The mid obtuse marginal was large, branching and normal.  4.  RIGHT CORONARY ARTERY:  Nondominant.  There was ostial long 25%      stenosis.   LEFT VENTRICULOGRAPHY:  The left  ventriculogram was obtained in the RAO  projection.  The EF was 60% with ectopy.   CONCLUSION:  1.  Normal coronaries.  2.  Normal left ventricular function.  3.  Severe AS, by this calculation.  4.  Mildly elevated pulmonary pressures.   PLAN:  The patient to be brought in for electrocardiogram to further  evaluate the aortic gradient.  She will also need pulmonary function tests  to see what component might be related to her pulmonary disease.       JH/MEDQ  D:  11/09/2004  T:  11/09/2004  Job:  [id number]   cc:   Aram Candela, M.D. [facility]

## 2010-09-15 NOTE — Discharge Summary (Signed)
NAMECATIA, TODOROV NO.:  [id number]   MEDICAL RECORD NO.:  [id number]          PATIENT TYPE:  INP   LOCATION:  4736                         FACILITY:  [facility]   PHYSICIAN:  Arvilla Meres, M.D. LHCDATE OF BIRTH:  03/17/1922   DATE OF ADMISSION:  11/08/2004  DATE OF DISCHARGE:  11/16/2004                                 DISCHARGE SUMMARY   SUMMARY OF HISTORY:  Ms. Yager is an 77 year old African-American female who  was admitted to [facility] on November 08, 2004, by Dr. Eliberto Ivory for a week  history of shortness of breath, we saw in consultation.  She also described  dyspnea on exertion, abdominal swelling with lower extremity edema although  she denied any chest discomfort or weight gain.  She states that she had not  missed any medications or had any dietary indiscretion.  She does admit to  drinking an increased amount of fluids secondary to the hot, humid weather.   Her history from a cardiac standpoint dates back to 1990, when a heart  catheterization by E. Graceann Congress, M.D., showed normal coronary arteries.  She had constrictive pericarditis and underwent stripping by Dr. Andrey Campanile.  She also has a history of hypertension, COPD, mild steroid-induced diabetes,  peripheral vascular disease, CVA with left-sided weakness, history of  pulmonary embolism in 1997 with respiratory arrest requiring sedation,  status post ASPVD and moderate aortic stenosis.  The last time we saw her  was in February 2004.   At [facility], BNP was only 70; however, D-dimer was elevated at  1013.  A CT scan did not show any evidence of pulmonary embolus.  It was  felt that her symptoms were secondary to COPD.  Echocardiogram showed an EF  of 65-70% with moderate AS.  It was noted that she was tachycardic and  hypertensive at [facility].  She was transferred to [facility] for better  blood pressure and heart rate control and further evaluation of her cardiac  and pulmonary  status.   LABORATORY DATA:  Chest x-ray at [facility] showed no active lung  disease, with hyperaeration suggestive of COPD on November 10, 2004.  Echocardiogram on November 13, 2004, showed an EF of 55-65%, inadequate for  evaluation of wall motion abnormalities.  It showed moderate aortic stenosis  with mild AI, mean gradient was 35.8 mmHg.  There was mild MAC with mild MR.   It was also noted at [facility], her theophylline level was 7.2.  CK  total, MBs, relative indexes were negative for myocardial infarction.  Troponins were slightly elevated at 0.06.  At [facility], H&H was 13.2 and  39.0.  Both MCV and MCH were low at 79.7 and 26.2, respectively.  Platelets  were 261.  WBC 5.0.  At [facility], sodium was 139, potassium 3.9, BUN 23,  creatinine 1.0, glucose 117, normal LFTs.  At [facility], admission  weight at [facility] was 133.5. There were no subsequent weights.  On the  13th, H&H was 13.8 and 42.5 with normal indices, platelets 280, WBC 10.1.  Subsequent hematologies were essentially unremarkable.  At the  time of  discharge on the 20th, H&H was 11.2 and 34.3, normal indices, platelets 288,  WBC 9.9.  On transfer to [facility] on the 13th, sodium was 133, potassium 3.2,  BUN 20, creatinine 0.9.  Chemistry on the 14th showed a potassium of 4.0.  BUN and creatinine on the 15th were 49 and 1.5.  On the 16th, BUN and  creatinine were 48 and 1.3.  Last chemistry performed was on the 18th, and  this showed a sodium of 135, potassium 4.7, BUN 30, creatinine 1.0, and  glucose 204.  Hemoglobin A1c was 6.6 on the 15th.  Subsequent troponins were  0.04 and 0.05.  BNP on the 15th was 175.4.  TSH was 0.641.  Sputum culture  showed moderate gram-positive cocci, few gram-negative rods.   HOSPITAL COURSE:  Ms. Livingston was transferred from [facility] for  further evaluation.  Pharmacy assisted with heparin management.  It was felt  that she should undergo right and left heart  catheterization to further  evaluate her aortic stenosis, possible recurring constriction, and etiology  of her shortness of breath.  Catheterization was performed on November 09, 2004,  by Dr. Antoine Poche.  She had normal LV function with severe aortic stenosis by  calculation with mildly elevated pulmonary pressures.  EF was 60%, and she  had a 20% ostial RCA lesion.  PFTs were performed and were compatible with  COPD with an FEV1 24% of predicted.  Pulmonary consultation was sought on  November 10, 2004, with Dr. Shelle Iron.  He felt that her shortness of breath was  multifactorial given her severe obstructive disease, hypertension and volume  status.  They adjusted her medications and also commented that she would be  a very poor surgical candidate.  Dr. Shelle Iron also asked her to discuss with  family end of life issues ahead of time in the event of prolonged vent  dependence in the future.  CVTS was consulted on July 17 to evaluate for the  possibility of aortic valve replacement.  CVTS felt that they would not  consider elective AVR unless they could clear cough, resolve ongoing  tracheobronchitis, and improve her nutritional status and, most importantly,  demonstrate some improvement in her pulmonary function on repeat spirometry.  They stated that they would be happy to reevaluate her as an outpatient post  above.  Over the next several days, medications were adjusted with improved  blood pressure response as well as her breathing.  Case management also  became involved to assist with outpatient management and has arranged  outpatient PT/OT, bathing aide and mobile meals as well as her home oxygen  and nebulizers.  Pulmonary listed her discharge instructions.  By November 16, 2004, Dr. Gala Romney felt that the patient was ready for discharge.   DISCHARGE DIAGNOSES:  1.  Shortness of breath, multifactorial, secondary to severe obstructive     disease, severe aortic stenosis, and volume overload and  hypertension.  2.  Hypertension.  3.  Hypokalemia.  4.  Acute renal insufficiency secondary to diuresis, resolved.  5.  Hyperglycemia with elevated hemoglobin A1c.  6.  Peripheral vascular disease with Dopplers on November 13, 2004, showing a      right ankle brachial index of 1.1 and a left ankle brachial index of      0.59.   History as previously.   DISPOSITION:  The patient is discharged home with the previously-made  arrangements by case management.  She was asked to make to maintain low  salt, fat, and cholesterol diet.  Her activities are not restricted.   Her medications include:  1.  Aspirin 81 mg daily.  2.  Spiriva 18 mcg daily.  3.  Advair 250/50 mcg two times a day.  4.  Oxygen 2 L 24 hours a day.  5.  Albuterol nebulizers 2.5 mg daily as needed for rescue.  6.  Albuterol MDI q.4h. as needed when not at home for rescue.  7.  Prednisone taper, 40 mg for one day, 30 mg the next, 20 mg one day, and      10 mg the next day, then discontinue.  8.  Cardizem CD 360 mg daily.  9.  Atacand 16/25 mg daily.  10. Protonix 40 mg daily.   I have written all these prescriptions.  There is not a medication list as  to what she was taken on the chart.  She will see Dr. Andee Lineman in [place] on November 22, 2004, at 1 p.m.  At that time she will have some blood work in regard to  a  [employer].  She was asked to call Dr. Shelle Iron at his office to arrange a three-  week appointment.  She is also asked to call her primary care physician, Dr.  Doyne Keel, and arrange a follow-up appointment in regard to her sugars.  She  was asked to bring all medications to all appointments.       EW/MEDQ  D:  11/16/2004  T:  11/16/2004  Job:  [id number]   cc:   Dr. Edilia Bo, [place]   Michelle Piper Dr. Andee Lineman, M.D.  [facility]  [address]  [place], [place] [address]   Wende Crease, M.D.   Marcelyn Bruins, M.D. [facility]

## 2010-09-15 NOTE — Assessment & Plan Note (Signed)
[name]                          [facility] OFFICE NOTE   Miranda, Bullock                      MRN:          [id number]  DATE:05/23/2006                            DOB:          December 10, 1921    PRIMARY CARDIOLOGIST:  Dr. Lewayne Bunting.   REASON FOR VISIT:  Post hospital followup.   [name] is a delightful 77 year old female, with known history of  severe aortic stenosis with normal coronary arteries and normal left  ventricular function, who was recently briefly hospitalized here at  [facility] with severe dizziness.  She was discharged with  presumptive diagnosis of acute labyrinthitis and has since followed up  with Dr. Katrinka Blazing.  She was placed on meclizine and has had symptomatic  improvement.   However, patient also had a 2D echocardiogram, which was reviewed by Dr.  Andee Lineman, for continued monitoring of her known severe aortic stenosis.  This yielded a calculated AVA of 0.9-1 cm2 with progression in mean  gradient to 40 mmHg and maximum gradient of 65 mmHg.  Left ventricular  function was preserved (EF 55-60%) with moderate LVH.   Patient, however, reports no interim development of any angina pectoris,  orthopnea, PND, lower extremity edema, or significant exertional dyspnea  since last seen by [place] in the clinic in August, 2006.  She feels that the  meclizine has provided some improvement of her symptoms.  She also  reports having experienced some tinnitus over this recent development of  dizziness.   Patient also had recent carotid Dopplers revealing less than 39% ICA  stenosis.   MEDICATIONS:  1. Aspirin 81 daily.  2. Spiriva.  3. Advair 250/50 b.i.d.  4. Atacand 16/25 daily.  5. Protonix.  6. Bumetanide 0.5 daily.  7. Albuterol MDI q.i.d.  8. Lyrica 100 q.h.s.  9. Meclizine 25 q.i.d. p.r.n.   PHYSICAL EXAM:  Blood pressure 142/68, pulse 80, regular, weight 130.  GENERAL:  An 77 year old female sitting upright, in  no distress.  HEENT:  Normocephalic, atraumatic.  NECK:  Palpable bilateral carotid pulses with high pitched bilateral  carotid bruits; diminished pulse on the down stroke.  LUNGS:  Diminished breath sounds in the bases with faint crackles; no  wheezes.  HEART:  Regular rate and rhythm (S1, S2), grade 3/6 crescendo-  decrescendo murmur heard loudest in the right upper sternal border.  No  diastolic below.  EXTREMITIES:  Palpable distal pulses with trace edema.  NEURO:  No focal deficits.   IMPRESSION:  1. Severe aortic stenosis.      a.     Calculated aortic valve area 0.9-1 cm2; mean gradient 40       mmHg by current 2D echocardiogram.  2. Preserved left ventricular function.  3. Bilateral carotid bruits.      a.     Noncritical bilateral internal carotid artery disease.  4. Normal coronary arteries.      a.     Cardiac catheterization July, 2006.  5. Chronic obstructive pulmonary disease.  6. Hypertension.  7. History of stroke.      a.     Associated  right hemiparesis.  8. Peripheral vascular disease.      a.     Lower extremity arterial Dopplers July, 2006:  1.1 right;       0.59 left.   PLAN:  1. Repeat followup 2D echocardiogram in 6 months for continued close      monitoring of severe aortic stenosis, followed by return clinic      followup with Dr. Andee Lineman at that time.  2. Check fasting lipid profile for reassessment of lipid status and      consideration of adding a cholesterol lowering agent.      Gene Serpe, PA-C  Electronically Signed      Learta Codding, MD,FACC  Electronically Signed   GS/MedQ  DD: 05/23/2006  DT: 05/23/2006  Job #: [id number]   cc:   Chetan H. Celine Mans, Dr.

## 2010-09-15 NOTE — Consult Note (Signed)
[name], [name]               ACCOUNT NO.:  [id number]   MEDICAL RECORD NO.:  [id number]          PATIENT TYPE:  INP   LOCATION:  4736                         FACILITY:  [facility]   PHYSICIAN:  Salvatore Decent. Cornelius Moras, M.D. DATE OF BIRTH:  Apr 26, 1922   DATE OF CONSULTATION:  11/13/2004  DATE OF DISCHARGE:                                   CONSULTATION   REFERRING PHYSICIAN:  Learta Codding, M.D.   PRIMARY CARE PHYSICIAN:  Florene Glen, M.D.   REASON FOR CONSULTATION:  Severe aortic stenosis.   HISTORY OF PRESENT ILLNESS:  [name] is an 77 year old African-American  female from [place], [place] with known history of aortic stenosis as  well as history of hypertension, chronic obstructive pulmonary disease,  peripheral vascular disease, previous stroke, and a prior pulmonary embolus.  Her cardiac history is somewhat complicated and full details of it are not  currently available. However, by report she underwent cardiac  catheterization in 1990 by Dr. Graceann Congress and was found to have evidence  of pericardial constriction. She reportedly underwent pericardial stripping  by Dr. Andrey Campanile, although she does not have evidence of a previous sternotomy.  In February 2004, she was hospitalized and underwent a 2-D echocardiogram  demonstrating normal left ventricular systolic function with moderate aortic  stenosis. The peak velocity across the aortic valve was estimated between  3.1 and 3.3 liters per second at that time.   [name] reports that over the years she had been hospitalized at  intermittently on numerous occasions for problems with shortness of breath.  At times, this has been attributed to asthma, cardiac disease, and pulmonary  embolus. She actually claims to be unaware of the fact that she has chronic  obstructive pulmonary disease. She states that this year she has not had any  problems with her breathing until recently.   She was admitted to [facility]  on [place], 2006 with  worsening shortness of breath. She denies any fevers and chills, although  she has had increased productive cough. She apparently also had some lower  extremity edema and abdominal swelling. She has not had any chest pain or  chest tightness. At the time of her hospital admission, her B-type  natriuretic peptide levels were low (77). She had an elevated D-dimer and  she underwent a chest CT scan that revealed no evidence for acute pulmonary  embolus. There remained findings consistent with severe chronic obstructive  pulmonary disease with hyperinflation and some scarring, although previous  infiltrate in the left lung had improved. She had borderline abnormal  cardiac enzymes with troponin levels between 0.05 and 0.06.   [name] was transferred to [facility] where she  underwent elective left and right heart catheterization by Dr. Rollene Rotunda on July13, 2006. Findings were notable for the presence of severe  aortic stenosis with mean transvalvular gradient across the aortic valve of  30 mmHg and estimated aortic valve area 0.78 cm2. Cardiac output was 5.3  liters per minute using thermodilution corresponding to a cardiac index of  3.4. A 2-D echocardiogram has  not been performed to confirm severity of  aortic stenosis. She was noted to have mild pulmonary hypertension. PA  pressures measured 41/23 with pulmonary capillary wedge pressure of 19. She  had normal coronary arteries with insignificant coronary artery disease and  right dominant coronary circulation. Left ventricular function was notably  preserved with normal left ventricular systolic function and ejection  fraction estimated greater than 60%. [name] was subsequently referred for  possible elective aortic valve replacement. During the interim period time,  she had been evaluated by Dr. Marcelyn Bruins from the pulmonary critical care  team due to her underlying severe chronic  obstructive pulmonary disease. Dr.  Marcelyn Bruins seemed disinclined that she would do well with an attempt at  aortic valve replacement as her underlying pulmonary function tests appear  to be quite severely reduced.   REVIEW OF SYSTEMS:  GENERAL:  The patient reports that her appetite is  stable. She has not been gaining or losing weight. CARDIAC: The patient  denies chest pain. She has chronic shortness of breath which waxes and wanes  in severity. She had to prop herself up on two to three pillows at night for  sleeping. She has had some lower extremity edema, although this is gone. She  has had occasional mild dizzy spells. She denies syncopal episodes. She  denies palpitations. She denies chest pain. RESPIRATORY: Notable for  productive cough that has been worse over the last weeks. It is productive  of thick, greenish sputum. She states that she has been treated for asthma  since age 40. She is not a smoker but she has been around heavy tobacco use  all of her life. She states that she only smoked briefly as a teenager. She  also has history of exposure to respiratory pathogens as she worked in [employer] for [employer] and [employer] in the past. GASTROINTESTINAL: Notable  for some history of reflux. The patient denies any difficulty swallowing.  She reports some recent mild constipation. She denies hematochezia,  hematemesis, melena. MUSCULOSKELETAL: Notable for mild lower chronic back  pain, mild chronic shoulder pain, history of left shoulder bursitis, history  of some mild arthritis in the hands and both knees. NEUROLOGIC: Notable for  history of stroke in 1995 that left her with some mild residual weakness and  numbness in her left lower leg. She denies any recent transient neurologic  deficits. At the time of her stroke, she had mild weakness in the left arm  and severe weakness and numbness in the left leg. This improved but did not resolve completely. GENITOURINARY: Negative.  INFECTIOUS: Negative. The  patient does have productive cough but she denies any recent fevers or  chills. White blood count is 9400 this admission. ENDOCRINE: Notable for  some history of steroid-induced diabetes mellitus. The patient is unclear  about this and seemed to have very poor understanding about whether or not  she has diabetes. HEMATOLOGIC: Notable for chronic iron deficient anemia.  PSYCHIATRIC: Negative. PERIPHERAL VASCULAR: Notable for bilateral calf  claudication, worse on the left than the right. HEENT: Notable for poor  dentition. She has not seen a dentist in quite some time. She did not know  about dental prophylaxis for valvular heart disease. She is not able to see  well from her right eye.   PAST MEDICAL HISTORY:  1.  Severe chronic obstructive pulmonary disease.  2.  Aortic stenosis.  3.  Hypertension.  4.  Cerebrovascular disease status post stroke 1995.  5.  Peripheral vascular disease.  6.  Pulmonary embolus 1997 complicated by respiratory arrest.  7.  History of constrictive pericarditis or pericardial effusion (details      unclear).  8.  Obesity.   PAST SURGICAL HISTORY:  Subxiphoid pericardial approach for pericardial  window and/or pericardial stripping (details unclear).   FAMILY HISTORY:  Noncontributory. Notable for the absence of premature  coronary artery disease or history of rheumatic heart disease.   SOCIAL HISTORY:  The patient is divorced and lives alone in [place]. She has a  large family with numerous supportive family members. She lives a sedentary  lifestyle. She is retired. She no longer drives an automobile. She does not  get around much although she states that her walking is limited more by  claudication than by her breathing. She denies significant alcohol  consumption.   MEDICATIONS PRIOR TO ADMISSION:  Albuterol inhaler, theophylline extended-  release, Protonix, Atacand/HCT, aspirin, Accolate, Entex, and Nystatin,   omeprazole.   ALLERGIES:  ACE INHIBITORS causing angioedema, PREDNISONE has caused  agitation in the past.   PHYSICAL EXAMINATION:  GENERAL:  The patient is moderately obese African-  American female who appears her stated age in no acute distress.  VITAL SIGNS:  She has been hypertensive with blood pressure 177/82. She is  in sinus rhythm. Oxygen saturation is 95% on 2 liters nasal cannula.  HEENT:  Exam is notable for poor dentition.  NECK:  The neck is supple. There is no cervical or supraclavicular  lymphadenopathy. There is no jugular venous distension.  CHEST:  Auscultation of the chest demonstrates diffuse inspiratory crackles  throughout both lung fields. Breath sounds are symmetrical. No wheezes or  rhonchi noted.  CARDIOVASCULAR:  Exam includes regular rate and rhythm. There is a grade 3/6  systolic murmur heard best along the sternal border with radiation towards  the neck. No diastolic murmurs are noted. The abdomen is mildly obese but soft, nontender. There is a previous scar from what would appear to be a  subxiphoid pericardial window. The patient has not had previous median  sternotomy. Bowel sounds are present. Liver edge is not palpable.  EXTREMITIES:  Warm and adequately perfused, although slightly cool in the  periphery. Distal pulses are not palpable in either lower leg at the ankle.  Ankle brachial index obtained today is severely reduced on the left (0.59)  and normal on the right (1.1). There is no sign of significant venous  insufficiency. There is no lower extremity edema.  RECTAL:  Deferred.  GENITOURINARY:  Deferred.  NEUROLOGIC:  Examination is grossly nonfocal with exception of fact that the  patient does describe some decreased sensation of left lower leg below the  knee.   DIAGNOSTIC TEST:  Cardiac catheterization performed by Dr. Rollene Rotunda on  ZOXW96, 2006 is reviewed. This demonstrates right dominant coronary  circulation with no significant  coronary artery disease. There does appear  to be severe aortic stenosis, although the mean gradient across the valve  was only 29.8 mmHg. The patient's baseline cardiac output was quite high  (6.2 liters per minute) which may correspond to the relatively low estimated  aortic valve area. There is mild pulmonary hypertension. There is normal  left ventricular systolic function with normal size left ventricular  chamber. 2-D echocardiogram remains pending at this time.   Pulmonary function tests were performed EAVW09, 2006 here at [facility]. The actual measured FEV-1 was 0.33 liters per minute with  a FVC of  0.81 liters per minute. These corresponds to severely reduced  findings of 24% and 39% predicted values. These were not repeated post  bronchodilator and the patient did not do diffusion capacity due to  shortness of breath. She was notably coughing with production of thick green  sputum throughout the exam raising questions as to its accuracy.  Chest x-  ray and chest CT scan confirmed the presence of severe obstructive pulmonary  disease. Arterial blood gas obtained at [facility] apparently on room  air on July12, 2006 is notable for a pH 7.37 with pCO2 of 57, pO2 56 and  bicarbonate 33. Arterial blood gas here at [facility]  performed on 2 liters nasal cannula on July13, 2006 notable for pH 7.37 with  pCO2 of 64, pO2 99.5 and a bicarbonate of 36.5.   IMPRESSION:  Moderate to severe aortic stenosis with severe underlying  chronic obstructive pulmonary disease and ongoing acute tracheobronchitis  with exacerbation of shortness of breath and perhaps some associated  congestive heart failure. Overall, I am impressed that Ms. Rapley's recent  exacerbation of shortness of breath may be more respiratory rather than  cardiac in origin. We will await the results of her echocardiogram at this  point and time. Based on the spirometry that has been  done and her current physical exam, I do not think that Ms. Latchford should be a candidate for  elective aortic valve replacement at this time. Whether or not her  respiratory might improve with aggressive medical treatment is unclear.   PLAN:  I have discussed issues at length with Ms. Cornforth and all of her  family. At this point, she is not on any sort of oral antibiotics and she is  not on maximal bronchodilator therapy. She is on steroids. It is possible  that with aggressive medical treatment of underlying emphysema and active  bronchitis, her respiratory function might improve somewhat. I would be  delighted to see her back in consultation in the future if this is the case,  but at this point I do not feel that elective aortic valve replacement would  be appropriate. Also all their questions been addressed.       CHO/MEDQ  D:  11/13/2004  T:  11/14/2004  Job:  [id number]   cc:   Learta Codding, M.D. [facility]  [address]  [address]  [place]  [place] [address]   Marcelyn Bruins, M.D. [facility]   Wende Crease, M.D.

## 2010-10-03 ENCOUNTER — Encounter: Payer: Medicare Other | Admitting: Cardiology

## 2010-10-04 ENCOUNTER — Other Ambulatory Visit: Payer: Self-pay | Admitting: *Deleted

## 2010-10-04 MED ORDER — ISOSORBIDE MONONITRATE ER 30 MG PO TB24: 30.0000 mg | ORAL_TABLET | Freq: Every day | ORAL | Status: DC

## 2010-10-06 ENCOUNTER — Encounter: Payer: Self-pay | Admitting: Cardiovascular Disease

## 2010-10-12 ENCOUNTER — Ambulatory Visit (INDEPENDENT_AMBULATORY_CARE_PROVIDER_SITE_OTHER): Payer: Medicare Other | Admitting: Cardiovascular Disease

## 2010-10-12 ENCOUNTER — Encounter: Payer: Self-pay | Admitting: Cardiovascular Disease

## 2010-10-12 DIAGNOSIS — I10 Essential (primary) hypertension: Secondary | ICD-10-CM

## 2010-10-12 DIAGNOSIS — I359 Nonrheumatic aortic valve disorder, unspecified: Secondary | ICD-10-CM

## 2010-10-12 DIAGNOSIS — R079 Chest pain, unspecified: Secondary | ICD-10-CM | POA: Insufficient documentation

## 2010-10-12 NOTE — Assessment & Plan Note (Signed)
The patient recently had brief atypical chest pain and was hospitalized. She ruled out for myocardial infarction and her echocardiogram was overall unremarkable. Cardiac catheterization before aortic procedure showed no significant coronary artery disease. Given that she has not had any recurrent symptoms, will not investigate this any further at this time.

## 2010-10-12 NOTE — Assessment & Plan Note (Signed)
She seems to be doing very well after she underwent TAVI last year. She does not have symptoms suggestive of angina, heart failure or arrhythmia. She had an echocardiogram done recently in May of this year which showed normal LV systolic function with a well-functioning aortic valve prosthesis. The mean gradient was only 10. She is going to followup with [facility] later this month as she is still enrolled in the clinical trial.

## 2010-10-12 NOTE — Patient Instructions (Signed)
Your physician wants you to follow-up in: 6 months. You will receive a reminder letter in the mail one-two months in advance. If you don't receive a letter, please call our office to schedule the follow-up appointment. Your physician recommends that you continue on your current medications as directed. Please refer to the Current Medication list given to you today. 

## 2010-10-12 NOTE — Progress Notes (Signed)
HPI  This is an 77 year old female who is here today for followup visit. She has a history of severe aortic stenosis without significant coronary artery disease. She is status post core valve TAVI procedure in June of 2011. Required a permanent pacemaker postoperatively. Since then, she has been doing reasonably well with significant improvement in her symptoms. She was hospitalized briefly at the end of April of this year at [facility] for atypical chest pain. She ruled out for myocardial infarction. She had an echocardiogram done which showed normal LV systolic function and normal functioning aortic prosthesis. She was discharged home. Since then, she has not had any further chest pain. She denies any dyspnea, dizziness or syncope. She actually has been feeling very well.  Allergies  Allergen Reactions  . Ace Inhibitors     REACTION: swelling     Current Outpatient Prescriptions on File Prior to Visit  Medication Sig Dispense Refill  . acetaminophen (TYLENOL) 325 MG tablet Take 650 mg by mouth every 6 (six) hours as needed.        Marland Kitchen albuterol (PROVENTIL) (2.5 MG/3ML) 0.083% nebulizer solution Take 2.5 mg by nebulization every 6 (six) hours as needed. UAD       . albuterol (VENTOLIN HFA) 108 (90 BASE) MCG/ACT inhaler Inhale 2 puffs into the lungs every 6 (six) hours as needed. UAD.       Marland Kitchen aspirin 81 MG chewable tablet Chew 81 mg by mouth daily.        . Calcium Carbonate-Vitamin D (CALTRATE 600+D) 600-400 MG-UNIT per tablet Take 1 tablet by mouth 2 (two) times daily.        . cilostazol (PLETAL) 100 MG tablet Take 100 mg by mouth 2 (two) times daily.        . cloNIDine (CATAPRES - DOSED IN MG/24 HR) 0.2 mg/24hr patch Place 1 patch onto the skin once a week.        . Ferrous Sulfate (IRON) 325 (65 FE) MG TABS Take 1 tablet by mouth 2 (two) times daily.        . Fluticasone-Salmeterol (ADVAIR DISKUS) 250-50 MCG/DOSE AEPB Inhale 1 puff into the lungs 2 (two) times daily.        .  isosorbide mononitrate (IMDUR) 30 MG 24 hr tablet Take 1 tablet (30 mg total) by mouth daily.  30 tablet  6  . loratadine (CLARITIN) 10 MG tablet Take 10 mg by mouth daily.        . Multiple Vitamin (MULTIVITAMIN) tablet Take 1 tablet by mouth daily.        . nitroGLYCERIN (NITROSTAT) 0.4 MG SL tablet Place 0.4 mg under the tongue every 5 (five) minutes as needed.        . pantoprazole (PROTONIX) 40 MG tablet Take 40 mg by mouth 2 (two) times daily.        Marland Kitchen senna (SENOKOT) 8.6 MG tablet Take 2 tablets by mouth daily.        Marland Kitchen tiotropium (SPIRIVA) 18 MCG inhalation capsule Place 18 mcg into inhaler and inhale daily.        Marland Kitchen DISCONTD: clopidogrel (PLAVIX) 75 MG tablet Take 75 mg by mouth daily.        Marland Kitchen DISCONTD: docusate sodium (COLACE) 100 MG capsule Take 100 mg by mouth 2 (two) times daily.        Marland Kitchen DISCONTD: dronedarone (MULTAQ) 400 MG tablet Take 400 mg by mouth 2 (two) times daily.        Marland Kitchen  DISCONTD: metoprolol (TOPROL-XL) 50 MG 24 hr tablet Take 50 mg by mouth daily.        Marland Kitchen DISCONTD: nystatin (MYCOSTATIN) 100000 UNIT/ML suspension Take 1 mL by mouth 3 (three) times daily.           Past Medical History  Diagnosis Date  . Aortic stenosis     severe. normal coronary arteries. Pt. had percutaneous valvulopasty done by balloon catheter at [facility]. Done 5/09. s/p core valve for aortic stenosis 6/11 TAVI procedure.   . Peripheral vascular disease   . DM2 (diabetes mellitus, type 2)   . Asthma   . HTN (hypertension)   . Gastroenteritis     Hx of it. last admission was 06/01/05  . Ejection fraction < 50%     echocardiogram in 08 showed preserved LV function w/EF of 55-60%.   . Diastolic heart failure     chronic  . Atypical chest pain   . COPD (chronic obstructive pulmonary disease) with emphysema   . PAD (peripheral artery disease)   . Cerebrovascular disease     noncritical      History reviewed. No pertinent past surgical history.   Family History  Problem Relation Age of  Onset  . Hypertension Neg Hx   . Diabetes Neg Hx   . Coronary artery disease Neg Hx      History   Social History  . Marital Status: Divorced    Spouse Name: N/A    Number of Children: N/A  . Years of Education: N/A   Occupational History  . Not on file.   Social History Main Topics  . Smoking status: Never Smoker   . Smokeless tobacco: Never Used   Comment: tobacco use - no  . Alcohol Use: No  . Drug Use: Not on file  . Sexually Active: Not on file   Other Topics Concern  . Not on file   Social History Narrative  . No narrative on file       PHYSICAL EXAM   BP 146/79  Pulse 81  Ht 5\' 1"  (1.549 m)  Wt 123 lb (55.792 kg)  BMI 23.24 kg/m2 Constitutional: She is oriented to person, place, and time. She appears well-developed and well-nourished. No distress.  HENT: No nasal discharge.  Head: Normocephalic and atraumatic.  Eyes: Pupils are equal, round, and reactive to light. Right eye exhibits no discharge. Left eye exhibits no discharge.  Neck: Normal range of motion. Neck supple. No JVD present. No thyromegaly present.  Cardiovascular: Normal rate, regular rhythm, normal heart sounds and intact distal pulses. Exam reveals no gallop and no friction rub.  There is a 1/6 systolic ejection murmur in the aortic area. Pulmonary/Chest: Effort normal and breath sounds normal. No stridor. No respiratory distress. She has no wheezes. She has no rales. She exhibits no tenderness.  Abdominal: Soft. Bowel sounds are normal. She exhibits no distension. There is no tenderness. There is no rebound and no guarding.  Musculoskeletal: Normal range of motion. She exhibits no edema and no tenderness.  Neurological: She is alert and oriented to person, place, and time. Coordination normal.  Skin: Skin is warm and dry. No rash noted. She is not diaphoretic. No erythema. No pallor.  Psychiatric: She has a normal mood and affect. Her behavior is normal. Judgment and thought content  normal.     EKG: Normal sinus rhythm with first degree AV block. Left atrial enlargement. Left bundle branch block.   ASSESSMENT AND PLAN

## 2011-05-08 ENCOUNTER — Ambulatory Visit (INDEPENDENT_AMBULATORY_CARE_PROVIDER_SITE_OTHER): Payer: Medicare Other | Admitting: Cardiology

## 2011-05-08 ENCOUNTER — Encounter: Payer: Self-pay | Admitting: Cardiology

## 2011-05-08 VITALS — BP 158/74 | HR 71 | Ht 61.0 in | Wt 126.0 lb

## 2011-05-08 DIAGNOSIS — Z01810 Encounter for preprocedural cardiovascular examination: Secondary | ICD-10-CM

## 2011-05-08 DIAGNOSIS — Z950 Presence of cardiac pacemaker: Secondary | ICD-10-CM

## 2011-05-08 DIAGNOSIS — I350 Nonrheumatic aortic (valve) stenosis: Secondary | ICD-10-CM

## 2011-05-08 DIAGNOSIS — I679 Cerebrovascular disease, unspecified: Secondary | ICD-10-CM

## 2011-05-08 DIAGNOSIS — R221 Localized swelling, mass and lump, neck: Secondary | ICD-10-CM

## 2011-05-08 DIAGNOSIS — I359 Nonrheumatic aortic valve disorder, unspecified: Secondary | ICD-10-CM

## 2011-05-08 MED ORDER — NITROGLYCERIN 0.4 MG SL SUBL: 0.4000 mg | SUBLINGUAL_TABLET | SUBLINGUAL | Status: AC | PRN

## 2011-05-08 NOTE — Patient Instructions (Addendum)
   CT Scan of neck with contrast Your physician recommends that you go to the [facility] for lab work for [employer] If the results of your test are normal or stable, you will receive a letter.  If they are abnormal, the nurse will contact you by phone. Your physician wants you to follow up in: 6 months.  You will receive a reminder letter in the mail one-two months in advance.  If you don't receive a letter, please call our office to schedule the follow up appointment  Pacemaker follow up also in 6 months

## 2011-05-14 ENCOUNTER — Other Ambulatory Visit: Payer: Self-pay | Admitting: Cardiology

## 2011-05-14 ENCOUNTER — Encounter: Payer: Self-pay | Admitting: Cardiology

## 2011-05-14 DIAGNOSIS — I350 Nonrheumatic aortic (valve) stenosis: Secondary | ICD-10-CM | POA: Insufficient documentation

## 2011-05-14 DIAGNOSIS — Z950 Presence of cardiac pacemaker: Secondary | ICD-10-CM | POA: Insufficient documentation

## 2011-05-14 DIAGNOSIS — I679 Cerebrovascular disease, unspecified: Secondary | ICD-10-CM | POA: Insufficient documentation

## 2011-05-14 DIAGNOSIS — R221 Localized swelling, mass and lump, neck: Secondary | ICD-10-CM

## 2011-05-14 NOTE — Progress Notes (Signed)
Peyton Bottoms, MD, [facility] ABIM Board Certified in Adult Cardiovascular Medicine,Internal Medicine and Critical Care Medicine    CC: followup patient with history of transcutaneous aortic valve replacement June 2011  HPI:  The patient is an elderly African American female with a history of severe aortic stenosis without significant coronary artery disease.  She status post Core Valve TAVR.  She has done well.  She reports no chest pain, shortness of breath, orthopnea or PND.  She has been followed at [facility] after her procedure.  The procedure was complicated by need for permanent pacemaker.  She reports no presyncope or syncope. From a cardiac standpoint.  The patient has no specific complaints.  She also has normal LV function. The patient did notice and mention to me that she felt a hard nodule in the left side of the neck.  She wanted my opinion about this. She reports no fevers or difficulty swallowing.  She also has not felt any other lymph nodes or masses.  PMH: reviewed and listed in Problem List in Electronic Records (and see below) Past Medical History  Diagnosis Date  . Aortic stenosis     s/p TAVR with normal coronaries.  . DM2 (diabetes mellitus, type 2)   . Asthma   . HTN (hypertension)   . Gastroenteritis     Hx of it. last admission was 06/01/05  . Ejection fraction < 50%     echocardiogram in 08 showed preserved LV function w/EF of 55-60%.   . Diastolic heart failure     chronic  . Atypical chest pain   . COPD (chronic obstructive pulmonary disease) with emphysema   . PAD (peripheral artery disease)   . Cerebrovascular disease     noncritical   . Pacemaker     status post pacemaker after TAVR 6 2011 at [facility]   No past surgical history on file.  Allergies/SH/FHX : available in Electronic Records for review  Allergies  Allergen Reactions  . Ace Inhibitors     REACTION: swelling   History   Social History  . Marital Status: Divorced   Spouse Name: N/A    Number of Children: N/A  . Years of Education: N/A   Occupational History  . Not on file.   Social History Main Topics  . Smoking status: Never Smoker   . Smokeless tobacco: Never Used   Comment: tobacco use - no  . Alcohol Use: No  . Drug Use: Not on file  . Sexually Active: Not on file   Other Topics Concern  . Not on file   Social History Narrative  . No narrative on file   Family History  Problem Relation Age of Onset  . Hypertension Neg Hx   . Diabetes Neg Hx   . Coronary artery disease Neg Hx     Medications: Current Outpatient Prescriptions  Medication Sig Dispense Refill  . acetaminophen (TYLENOL) 325 MG tablet Take 650 mg by mouth every 6 (six) hours as needed.        Marland Kitchen albuterol (PROVENTIL) (2.5 MG/3ML) 0.083% nebulizer solution Take 2.5 mg by nebulization 3 (three) times daily. UAD      . albuterol (VENTOLIN HFA) 108 (90 BASE) MCG/ACT inhaler Inhale 2 puffs into the lungs every 6 (six) hours as needed. UAD.       Marland Kitchen amLODipine (NORVASC) 10 MG tablet Take 10 mg by mouth daily.        . Artificial Tear Ointment (EYE LUBRICANT OP) Place  2 drops into both eyes 4 (four) times daily.        Marland Kitchen aspirin 81 MG chewable tablet Chew 81 mg by mouth daily.        . Calcium Carbonate-Vitamin D (CALTRATE 600+D) 600-400 MG-UNIT per tablet Take 1 tablet by mouth 2 (two) times daily.        . cilostazol (PLETAL) 100 MG tablet Take 100 mg by mouth 2 (two) times daily.        . cloNIDine (CATAPRES - DOSED IN MG/24 HR) 0.1 mg/24hr patch Place 1 patch onto the skin once a week.        . Ferrous Sulfate (IRON) 325 (65 FE) MG TABS Take 1 tablet by mouth 2 (two) times daily.        . Fluticasone-Salmeterol (ADVAIR DISKUS) 250-50 MCG/DOSE AEPB Inhale 1 puff into the lungs 2 (two) times daily.        . isosorbide mononitrate (IMDUR) 30 MG 24 hr tablet Take 1 tablet (30 mg total) by mouth daily.  30 tablet  6  . metoprolol (LOPRESSOR) 50 MG tablet Take 50 mg by mouth 2  (two) times daily.        . Multiple Vitamin (MULTIVITAMIN) tablet Take 1 tablet by mouth daily.        . nitroGLYCERIN (NITROSTAT) 0.4 MG SL tablet Place 1 tablet (0.4 mg total) under the tongue every 5 (five) minutes as needed.  25 tablet  3  . pantoprazole (PROTONIX) 40 MG tablet Take 40 mg by mouth 2 (two) times daily.        Marland Kitchen senna (SENOKOT) 8.6 MG tablet Take 2 tablets by mouth daily.        Marland Kitchen tiotropium (SPIRIVA) 18 MCG inhalation capsule Place 18 mcg into inhaler and inhale daily.          ROS: No nausea or vomiting. No fever or chills.No melena or hematochezia.No bleeding.No claudication  Physical Exam: BP 158/74  Pulse 71  Ht 5\' 1"  (1.549 m)  Wt 126 lb (57.153 kg)  BMI 23.81 kg/m2 General:well-nourished African American female in no distress Neck:normal.  Upstroke.  No carotid bruits.  Patient has a hard nodule which is tender and non-mobile in the left upper neck area. Lungs:clear breath sounds bilaterally without wheezing Cardiac:regular rate and rhythm with normal S1, S2.  No murmur, rubs or gallops Vascular:no edema.  Normal distal pulses Skin:warm and dry Physcologic:normal affect  12lead ECG:not performed Limited bedside ECHO:N/A   Patient Active Problem List  Diagnoses  . ANEMIA, NORMOCYTIC  . VENTRICULAR ECTOPY  . ACUTE ON CHRONIC DIASTOLIC HEART FAILURE-no recurrent symptoms after aortic valve replacement  . RENAL DISEASE, CHRONIC, MILD  . CAROTID BRUIT-stable.  No significant carotid disease  . CARDIAC PACEMAKER IN SITU  . Chest pain-no significant coronary artery disease  . Cerebrovascular disease  . * Aortic stenosis-status posttAVR June 2011 Neck nodule    PLAN   From a cardiac standpoint, the patient is doing well.  We will obtain an echocardiogram in 6 months.  Her valve replacement appears to be stable and the patient is asymptomatic  Pacemaker is followed in the pacemaker clinic now.  The patient does have a new finding of a hard nodule  which is painful in the left neck area.  I have scheduled her for a CT scan of the neck with contrast.we will forward the results to her primary care physician.

## 2011-05-15 ENCOUNTER — Telehealth: Payer: Self-pay | Admitting: *Deleted

## 2011-05-15 NOTE — Telephone Encounter (Signed)
No precert required 

## 2011-05-15 NOTE — Telephone Encounter (Signed)
CT SCAN OF NECK WITH CONTRAST (BUN 25, Cre+eGFR 1.06)  DIAG 784.2 Scheduled for 05-17-2011 @ [facility]

## 2011-05-21 ENCOUNTER — Encounter: Payer: Self-pay | Admitting: Cardiology

## 2011-05-21 DIAGNOSIS — R221 Localized swelling, mass and lump, neck: Secondary | ICD-10-CM | POA: Insufficient documentation

## 2011-05-21 DIAGNOSIS — I871 Compression of vein: Secondary | ICD-10-CM | POA: Insufficient documentation

## 2011-06-04 ENCOUNTER — Other Ambulatory Visit: Payer: Self-pay | Admitting: *Deleted

## 2011-06-04 MED ORDER — ISOSORBIDE MONONITRATE ER 30 MG PO TB24: 30.0000 mg | ORAL_TABLET | Freq: Every day | ORAL | Status: DC

## 2011-08-20 ENCOUNTER — Ambulatory Visit (INDEPENDENT_AMBULATORY_CARE_PROVIDER_SITE_OTHER): Payer: Medicare Other | Admitting: *Deleted

## 2011-08-20 ENCOUNTER — Encounter: Payer: Self-pay | Admitting: Internal Medicine

## 2011-08-20 DIAGNOSIS — I495 Sick sinus syndrome: Secondary | ICD-10-CM

## 2011-08-20 LAB — PACEMAKER DEVICE OBSERVATION
AL AMPLITUDE: 2.8 mv
ATRIAL PACING PM: 0
BAMS-0001: 175 {beats}/min
RV LEAD THRESHOLD: 0.75 V
VENTRICULAR PACING PM: 0

## 2011-08-20 NOTE — Progress Notes (Signed)
Pacer check in clinic  

## 2011-11-30 ENCOUNTER — Encounter: Payer: Medicare Other | Admitting: Internal Medicine

## 2012-01-04 ENCOUNTER — Encounter: Payer: Self-pay | Admitting: Internal Medicine

## 2012-01-04 ENCOUNTER — Ambulatory Visit (INDEPENDENT_AMBULATORY_CARE_PROVIDER_SITE_OTHER): Payer: Medicare Other | Admitting: Internal Medicine

## 2012-01-04 VITALS — BP 162/70 | HR 84 | Ht 60.0 in | Wt 126.0 lb

## 2012-01-04 DIAGNOSIS — Z950 Presence of cardiac pacemaker: Secondary | ICD-10-CM

## 2012-01-04 DIAGNOSIS — I10 Essential (primary) hypertension: Secondary | ICD-10-CM

## 2012-01-04 DIAGNOSIS — I442 Atrioventricular block, complete: Secondary | ICD-10-CM

## 2012-01-04 LAB — PACEMAKER DEVICE OBSERVATION
AL IMPEDENCE PM: 546 Ohm
AL THRESHOLD: 0.75 V
ATRIAL PACING PM: 0.5
BATTERY VOLTAGE: 2.8 V
RV LEAD IMPEDENCE PM: 459 Ohm
VENTRICULAR PACING PM: 0.5

## 2012-01-04 NOTE — Progress Notes (Signed)
Ignatius Specking., MD:  [name] is a 78 y.o. female with a h/o transient AV block with TAVR procedure at [facility] (MDT) by Dr Dr Robin Searing who presents today to establish care in the Electrophysiology device clinic.   The patient reports doing very well since having a pacemaker implanted and remains very active despite her age.  Her primary concern is with hip pain.  Today, she  denies symptoms of palpitations, chest pain, shortness of breath, orthopnea, PND, lower extremity edema, dizziness, presyncope, syncope, or neurologic sequela.  The patientis tolerating medications without difficulties and is otherwise without complaint today.   Past Medical History  Diagnosis Date  . Aortic stenosis     s/p TAVR with normal coronaries.  . DM2 (diabetes mellitus, type 2)   . Asthma   . HTN (hypertension)   . Gastroenteritis     Hx of it. last admission was 06/01/05  . Ejection fraction < 50%     echocardiogram in 08 showed preserved LV function w/EF of 55-60%.   . Diastolic heart failure     chronic  . Atypical chest pain   . COPD (chronic obstructive pulmonary disease) with emphysema   . PAD (peripheral artery disease)   . Cerebrovascular disease     noncritical   . Pacemaker     status post pacemaker after TAVR 6 2011 at [facility]  . Subclavian vein stenosis     both innominate vein and left subclavian vein stenosis secondary to PPM implantation- incidental finding neck CT scan.  . Nodule of neck     neagtive CT scan neck for malingnancy.    No past surgical history on file.  History   Social History  . Marital Status: Divorced    Spouse Name: N/A    Number of Children: N/A  . Years of Education: N/A   Occupational History  . Not on file.   Social History Main Topics  . Smoking status: Never Smoker   . Smokeless tobacco: Never Used   Comment: tobacco use - no  . Alcohol Use: No  . Drug Use: Not on file  . Sexually Active: Not on file   Other Topics Concern    . Not on file   Social History Narrative  . No narrative on file    Family History  Problem Relation Age of Onset  . Hypertension Neg Hx   . Diabetes Neg Hx   . Coronary artery disease Neg Hx     Allergies  Allergen Reactions  . Ace Inhibitors     REACTION: swelling    Current Outpatient Prescriptions  Medication Sig Dispense Refill  . acetaminophen (TYLENOL) 325 MG tablet Take 650 mg by mouth every 6 (six) hours as needed.        Marland Kitchen albuterol (PROVENTIL) (2.5 MG/3ML) 0.083% nebulizer solution Take 2.5 mg by nebulization 3 (three) times daily. UAD      . albuterol (VENTOLIN HFA) 108 (90 BASE) MCG/ACT inhaler Inhale 2 puffs into the lungs every 6 (six) hours as needed. UAD.       Marland Kitchen amLODipine (NORVASC) 10 MG tablet Take 10 mg by mouth daily.        . Artificial Tear Ointment (EYE LUBRICANT OP) Place 2 drops into both eyes 3 (three) times daily.       Marland Kitchen aspirin 81 MG chewable tablet Chew 81 mg by mouth daily.        . Calcium Carbonate-Vitamin D (CALTRATE 600+D) 600-400  MG-UNIT per tablet Take 1 tablet by mouth 2 (two) times daily.        . cilostazol (PLETAL) 100 MG tablet Take 100 mg by mouth daily.       . cloNIDine (CATAPRES - DOSED IN MG/24 HR) 0.1 mg/24hr patch Place 1 patch onto the skin once a week.        . Ferrous Sulfate (IRON) 325 (65 FE) MG TABS Take 1 tablet by mouth 2 (two) times daily.        . Fluticasone-Salmeterol (ADVAIR DISKUS) 250-50 MCG/DOSE AEPB Inhale 1 puff into the lungs 2 (two) times daily.        . isosorbide mononitrate (IMDUR) 30 MG 24 hr tablet Take 1 tablet (30 mg total) by mouth daily.  30 tablet  6  . metoprolol (LOPRESSOR) 50 MG tablet Take 50 mg by mouth daily.       . Multiple Vitamin (MULTIVITAMIN) tablet Take 1 tablet by mouth daily.        . nitroGLYCERIN (NITROSTAT) 0.4 MG SL tablet Place 1 tablet (0.4 mg total) under the tongue every 5 (five) minutes as needed.  25 tablet  3  . pantoprazole (PROTONIX) 40 MG tablet Take 40 mg by mouth 2  (two) times daily.        Marland Kitchen senna (SENOKOT) 8.6 MG tablet Take 2 tablets by mouth daily as needed.       . tiotropium (SPIRIVA) 18 MCG inhalation capsule Place 18 mcg into inhaler and inhale daily.          ROS- all systems are reviewed and negative except as per HPI  Physical Exam: Filed Vitals:   01/04/12 1004  BP: 162/70  Pulse: 84  Height: 5' (1.524 m)  Weight: 126 lb (57.153 kg)    GEN- The patient is elderly appearing, alert and oriented x 3 today.   Head- normocephalic, atraumatic Eyes-  Sclera clear, conjunctiva pink Ears- hearing intact Oropharynx- clear Neck- supple  Lungs- Clear to ausculation bilaterally, normal work of breathing Chest- pacemaker pocket is well healed Heart- Regular rate and rhythm  GI- soft, NT, ND, + BS Extremities- no clubbing, cyanosis, or edema MS- age appropriate atrophy Skin- no rash or lesion Psych- euthymic mood, full affect Neuro- strength and sensation are intact  Pacemaker interrogation- reviewed in detail today,  See PACEART report  Assessment and Plan:

## 2012-01-04 NOTE — Assessment & Plan Note (Signed)
Above goal today She reports better control at home and does not wish to make changes today

## 2012-01-04 NOTE — Assessment & Plan Note (Signed)
Normal pacemaker function See Pace Art report No changes today  Return to the device clinic in 6 months 

## 2012-01-04 NOTE — Patient Instructions (Addendum)
Your physician wants you to follow-up in: 6 months with [facility] device clinic and 12 months with Dr Johney Frame.  You will receive a reminder letter in the mail two months in advance. If you don't receive a letter, please call our office to schedule the follow-up appointment.  Your physician recommends that you continue on your current medications as directed. Please refer to the Current Medication list given to you today.   Your physician recommends that you schedule a follow-up appointment with cardiologist in our clinic.

## 2012-01-31 ENCOUNTER — Encounter: Payer: Self-pay | Admitting: Cardiovascular Disease

## 2012-01-31 ENCOUNTER — Ambulatory Visit (INDEPENDENT_AMBULATORY_CARE_PROVIDER_SITE_OTHER): Payer: Medicare Other | Admitting: Cardiovascular Disease

## 2012-01-31 VITALS — BP 138/62 | HR 72 | Ht 60.0 in | Wt 124.4 lb

## 2012-01-31 DIAGNOSIS — I739 Peripheral vascular disease, unspecified: Secondary | ICD-10-CM

## 2012-01-31 DIAGNOSIS — I350 Nonrheumatic aortic (valve) stenosis: Secondary | ICD-10-CM

## 2012-01-31 DIAGNOSIS — I359 Nonrheumatic aortic valve disorder, unspecified: Secondary | ICD-10-CM

## 2012-01-31 DIAGNOSIS — R079 Chest pain, unspecified: Secondary | ICD-10-CM

## 2012-01-31 DIAGNOSIS — I10 Essential (primary) hypertension: Secondary | ICD-10-CM

## 2012-01-31 DIAGNOSIS — Z950 Presence of cardiac pacemaker: Secondary | ICD-10-CM

## 2012-01-31 NOTE — Progress Notes (Signed)
HPI  This is an 78 year old female who is here today for followup visit. She has a history of severe aortic stenosis without significant coronary artery disease. She is status post core valve TAVR procedure in June of 2011. Required a permanent pacemaker postoperatively. Since then, she has been doing reasonably well with significant improvement in her symptoms. She was hospitalized last year briefly at  [facility] for atypical chest pain. She ruled out for myocardial infarction. She had an echocardiogram done which showed normal LV systolic function and normal functioning aortic prosthesis.  She still follows up at [facility] as she is part of a clinical trial. She denies any chest pain or significant dyspnea. Her biggest issue at this time seems to be lower extremity claudication. She complains of bilateral calf claudication worse on the right side with minimal walking. This happens after walking less than 50 feet and forces her to stop and rest for a few minutes before she can resume again. She also noticed discomfort in the right groin recently at the TAVR femoral artery access site. She's not sure if it's related tobacco or to her arthritis in the hip.  Allergies  Allergen Reactions  . Ace Inhibitors     REACTION: swelling     Current Outpatient Prescriptions on File Prior to Visit  Medication Sig Dispense Refill  . acetaminophen (TYLENOL) 325 MG tablet Take 650 mg by mouth every 6 (six) hours as needed.        Marland Kitchen albuterol (PROVENTIL) (2.5 MG/3ML) 0.083% nebulizer solution Take 2.5 mg by nebulization 3 (three) times daily. UAD      . albuterol (VENTOLIN HFA) 108 (90 BASE) MCG/ACT inhaler Inhale 2 puffs into the lungs every 6 (six) hours as needed. UAD.       Marland Kitchen amLODipine (NORVASC) 10 MG tablet Take 10 mg by mouth daily.        . Artificial Tear Ointment (EYE LUBRICANT OP) Place 2 drops into both eyes 3 (three) times daily.       Marland Kitchen aspirin 81 MG chewable tablet Chew 81 mg by mouth daily.         . Calcium Carbonate-Vitamin D (CALTRATE 600+D) 600-400 MG-UNIT per tablet Take 1 tablet by mouth 2 (two) times daily.        . cilostazol (PLETAL) 100 MG tablet Take 100 mg by mouth daily.       . cloNIDine (CATAPRES - DOSED IN MG/24 HR) 0.1 mg/24hr patch Place 1 patch onto the skin once a week.        . Ferrous Sulfate (IRON) 325 (65 FE) MG TABS Take 1 tablet by mouth 2 (two) times daily.        . Fluticasone-Salmeterol (ADVAIR DISKUS) 250-50 MCG/DOSE AEPB Inhale 1 puff into the lungs 2 (two) times daily.        . isosorbide mononitrate (IMDUR) 30 MG 24 hr tablet Take 1 tablet (30 mg total) by mouth daily.  30 tablet  6  . metoprolol (LOPRESSOR) 50 MG tablet Take 50 mg by mouth daily.       . Multiple Vitamin (MULTIVITAMIN) tablet Take 1 tablet by mouth daily.        . nitroGLYCERIN (NITROSTAT) 0.4 MG SL tablet Place 1 tablet (0.4 mg total) under the tongue every 5 (five) minutes as needed.  25 tablet  3  . pantoprazole (PROTONIX) 40 MG tablet Take 40 mg by mouth 2 (two) times daily.        Marland Kitchen senna (SENOKOT)  8.6 MG tablet Take 2 tablets by mouth daily as needed.       . tiotropium (SPIRIVA) 18 MCG inhalation capsule Place 18 mcg into inhaler and inhale daily.           Past Medical History  Diagnosis Date  . Aortic stenosis     s/p TAVR with normal coronaries.  . DM2 (diabetes mellitus, type 2)   . Asthma   . HTN (hypertension)   . Gastroenteritis     Hx of it. last admission was 06/01/05  . Ejection fraction < 50%     echocardiogram in 08 showed preserved LV function w/EF of 55-60%.   . Diastolic heart failure     chronic  . Atypical chest pain   . COPD (chronic obstructive pulmonary disease) with emphysema   . PAD (peripheral artery disease)   . Cerebrovascular disease     noncritical   . Pacemaker     status post pacemaker after TAVR 6 2011 at [facility]  . Subclavian vein stenosis     both innominate vein and left subclavian vein stenosis secondary to PPM implantation-  incidental finding neck CT scan.  . Nodule of neck     neagtive CT scan neck for malingnancy.      No past surgical history on file.   Family History  Problem Relation Age of Onset  . Hypertension Neg Hx   . Diabetes Neg Hx   . Coronary artery disease Neg Hx      History   Social History  . Marital Status: Divorced    Spouse Name: N/A    Number of Children: N/A  . Years of Education: N/A   Occupational History  . Not on file.   Social History Main Topics  . Smoking status: Never Smoker   . Smokeless tobacco: Never Used   Comment: tobacco use - no  . Alcohol Use: No  . Drug Use: Not on file  . Sexually Active: Not on file   Other Topics Concern  . Not on file   Social History Narrative  . No narrative on file       PHYSICAL EXAM   BP 138/62  Pulse 72  Ht 5' (1.524 m)  Wt 124 lb 6.4 oz (56.427 kg)  BMI 24.30 kg/m2 Constitutional: She is oriented to person, place, and time. She appears well-developed and well-nourished. No distress.  HENT: No nasal discharge.  Head: Normocephalic and atraumatic.  Eyes: Pupils are equal, round, and reactive to light. Right eye exhibits no discharge. Left eye exhibits no discharge.  Neck: Normal range of motion. Neck supple. No JVD present. No thyromegaly present.  Cardiovascular: Normal rate, regular rhythm, normal heart sounds and intact distal pulses. Exam reveals no gallop and no friction rub.  There is a 1/6 systolic ejection murmur in the aortic area. Pulmonary/Chest: Effort normal and breath sounds normal. No stridor. No respiratory distress. She has no wheezes. She has no rales. She exhibits no tenderness.  Abdominal: Soft. Bowel sounds are normal. She exhibits no distension. There is no tenderness. There is no rebound and no guarding.  Musculoskeletal: Normal range of motion. She exhibits no edema and no tenderness.  Neurological: She is alert and oriented to person, place, and time. Coordination normal.  Skin:  Skin is warm and dry. No rash noted. She is not diaphoretic. No erythema. No pallor.  Psychiatric: She has a normal mood and affect. Her behavior is normal. Judgment and thought content normal.  Vascular: Femoral pulses are normal bilaterally with bilateral bruits. She is tender at the surgical scar. Distal pulses are not palpable.   EKG: Sinus  Rhythm  -Left bundle branch block and left axis.   -Left atrial enlargement.   ABNORMAL     ASSESSMENT AND PLAN

## 2012-01-31 NOTE — Assessment & Plan Note (Signed)
Status post TAVR. She seems to be stable with no cardiac complaints. She had an echocardiogram done last year. She also still follows up at [facility] as she is part of a clinical trial.

## 2012-01-31 NOTE — Assessment & Plan Note (Signed)
This is being followed by our device clinic. 

## 2012-01-31 NOTE — Patient Instructions (Addendum)
Your physician has requested that you have a lower extremity arterial exercise duplex. During this test, exercise and ultrasound are used to evaluate arterial blood flow in the legs. Allow one hour for this exam. There are no restrictions or special instructions.  Your physician has requested that you have an ankle brachial index (ABI). During this test an ultrasound and blood pressure cuff are used to evaluate the arteries that supply the arms and legs with blood. Allow thirty minutes for this exam. There are no restrictions or special instructions.  Follow up in 6 months.

## 2012-01-31 NOTE — Assessment & Plan Note (Signed)
The patient has significant claudication in both calves worse on the right side. Distal pulses are not palpable. It appears that she is on Pletal but not sure if she has known history of PAD. I will request an ABI and lower extremity arterial duplex ultrasound for evaluation.

## 2012-01-31 NOTE — Assessment & Plan Note (Signed)
Blood pressure is reasonably controlled. Continue current medications. 

## 2012-02-07 ENCOUNTER — Other Ambulatory Visit: Payer: Self-pay | Admitting: *Deleted

## 2012-02-07 ENCOUNTER — Encounter (INDEPENDENT_AMBULATORY_CARE_PROVIDER_SITE_OTHER): Payer: Medicare Other

## 2012-02-07 DIAGNOSIS — I70219 Atherosclerosis of native arteries of extremities with intermittent claudication, unspecified extremity: Secondary | ICD-10-CM

## 2012-02-07 DIAGNOSIS — I739 Peripheral vascular disease, unspecified: Secondary | ICD-10-CM

## 2012-02-18 ENCOUNTER — Telehealth: Payer: Self-pay | Admitting: *Deleted

## 2012-02-18 MED ORDER — CILOSTAZOL 100 MG PO TABS: 100.0000 mg | ORAL_TABLET | Freq: Two times a day (BID) | ORAL | Status: DC

## 2012-02-18 NOTE — Telephone Encounter (Signed)
Patient informed and rx sent to [facility].

## 2012-02-18 NOTE — Telephone Encounter (Signed)
Message copied by Eustace Moore on Mon Feb 18, 2012  3:30 PM ------      Message from: Eustace Moore      Created: Fri Feb 15, 2012  2:23 PM                   ----- Message -----         From: Iran Ouch, MD         Sent: 02/13/2012   5:29 PM           To: Eustace Moore, LPN            LE Duplex showed occluded SFA (artery in thigh) in both sides. Not easy to fix with stents. Let's increase Pletal to 100 mg twice daily and see if her discomfort gets better. She should try to walk regularly for exercise to improve the circulation.

## 2012-07-09 ENCOUNTER — Encounter: Payer: Self-pay | Admitting: Internal Medicine

## 2012-07-09 ENCOUNTER — Ambulatory Visit (INDEPENDENT_AMBULATORY_CARE_PROVIDER_SITE_OTHER): Payer: Medicare Other | Admitting: Internal Medicine

## 2012-07-09 VITALS — BP 142/83 | HR 86 | Ht 60.0 in | Wt 130.8 lb

## 2012-07-09 DIAGNOSIS — I4891 Unspecified atrial fibrillation: Secondary | ICD-10-CM

## 2012-07-09 DIAGNOSIS — I10 Essential (primary) hypertension: Secondary | ICD-10-CM

## 2012-07-09 DIAGNOSIS — I442 Atrioventricular block, complete: Secondary | ICD-10-CM

## 2012-07-09 LAB — PACEMAKER DEVICE OBSERVATION
AL IMPEDENCE PM: 504 Ohm
AL THRESHOLD: 0.75 V
RV LEAD AMPLITUDE: 31.36 mv

## 2012-07-09 MED ORDER — WARFARIN SODIUM 2.5 MG PO TABS: 2.5000 mg | ORAL_TABLET | Freq: Every evening | ORAL | Status: DC

## 2012-07-09 NOTE — Patient Instructions (Addendum)
   Begin Coumadin 2.5mg  every evening  Stop Aspirin after INR greater than 2.0  Dr. Sherril Croon to follow INR's  Continue all other current medications. Kristin - 6 months Allred - 1 year

## 2012-07-09 NOTE — Progress Notes (Signed)
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 female who presents today for routine electrophysiology followup.  Since last being seen in our clinic, the patient reports doing very well.  Her primary concern is with hip pain.  Today, she denies symptoms of palpitations, chest pain, shortness of breath,  lower extremity edema, dizziness, presyncope, or syncope.  The patient is otherwise without complaint today.   Past Medical History  Diagnosis Date  . Aortic stenosis     s/p TAVR with normal coronaries.  . DM2 (diabetes mellitus, type 2)   . Asthma   . HTN (hypertension)   . Gastroenteritis     Hx of it. last admission was 06/01/05  . Ejection fraction < 50%     echocardiogram in 08 showed preserved LV function w/EF of 55-60%.   . Diastolic heart failure     chronic  . Atypical chest pain   . COPD (chronic obstructive pulmonary disease) with emphysema   . PAD (peripheral artery disease)   . Cerebrovascular disease     noncritical   . Pacemaker     status post pacemaker after TAVR 6 2011 at [facility]  . Subclavian vein stenosis     both innominate vein and left subclavian vein stenosis secondary to PPM implantation- incidental finding neck CT scan.  . Nodule of neck     neagtive CT scan neck for malingnancy.    No past surgical history on file.  Current Outpatient Prescriptions  Medication Sig Dispense Refill  . acetaminophen (TYLENOL) 325 MG tablet Take 650 mg by mouth every 6 (six) hours as needed.        Marland Kitchen albuterol (PROVENTIL) (2.5 MG/3ML) 0.083% nebulizer solution Take 2.5 mg by nebulization 3 (three) times daily. UAD      . albuterol (VENTOLIN HFA) 108 (90 BASE) MCG/ACT inhaler Inhale 2 puffs into the lungs every 6 (six) hours as needed. UAD.       Marland Kitchen amLODipine (NORVASC) 10 MG tablet Take 10 mg by mouth daily.        . Artificial Tear Ointment (EYE LUBRICANT OP) Place 2 drops into both eyes 3 (three) times daily.       Marland Kitchen aspirin  81 MG chewable tablet Chew 81 mg by mouth daily.        . Calcium Carbonate-Vitamin D (CALTRATE 600+D) 600-400 MG-UNIT per tablet Take 1 tablet by mouth 2 (two) times daily.        . cilostazol (PLETAL) 100 MG tablet Take 1 tablet (100 mg total) by mouth 2 (two) times daily.  60 tablet  6  . clonazePAM (KLONOPIN) 0.5 MG tablet Take 0.5 mg by mouth at bedtime.      . cloNIDine (CATAPRES - DOSED IN MG/24 HR) 0.1 mg/24hr patch Place 1 patch onto the skin once a week.        . Ferrous Sulfate (IRON) 325 (65 FE) MG TABS Take 1 tablet by mouth 2 (two) times daily.        . Fluticasone-Salmeterol (ADVAIR DISKUS) 250-50 MCG/DOSE AEPB Inhale 1 puff into the lungs 2 (two) times daily.        . isosorbide mononitrate (IMDUR) 30 MG 24 hr tablet Take 1 tablet (30 mg total) by mouth daily.  30 tablet  6  . metoprolol (LOPRESSOR) 50 MG tablet Take 50 mg by mouth daily.       . Multiple Vitamin (MULTIVITAMIN) tablet Take 1 tablet by  mouth daily.        . nitroGLYCERIN (NITROSTAT) 0.4 MG SL tablet Place 1 tablet (0.4 mg total) under the tongue every 5 (five) minutes as needed.  25 tablet  3  . pantoprazole (PROTONIX) 40 MG tablet Take 40 mg by mouth 2 (two) times daily.        Marland Kitchen senna (SENOKOT) 8.6 MG tablet Take 2 tablets by mouth daily as needed.       . tiotropium (SPIRIVA) 18 MCG inhalation capsule Place 18 mcg into inhaler and inhale daily.         No current facility-administered medications for this visit.    Physical Exam: Filed Vitals:   07/09/12 1026  BP: 142/83  Pulse: 86  Height: 5' (1.524 m)  Weight: 130 lb 12.8 oz (59.33 kg)    GEN- The patient is well appearing, alert and oriented x 3 today.   Head- normocephalic, atraumatic Eyes-  Sclera clear, conjunctiva pink Ears- hearing intact Oropharynx- clear Lungs- Clear to ausculation bilaterally, normal work of breathing Chest- pacemaker pocket is well healed Heart- Regular rate and rhythm, no murmurs, rubs or gallops, PMI not laterally  displaced GI- soft, NT, ND, + BS Extremities- no clubbing, cyanosis, or edema  Pacemaker interrogation- reviewed in detail today,  See PACEART report  Assessment and Plan:  1. Mobitz II AV block Normal pacemaker function See Pace Art report No changes today  2. afib This is a new diagnosis by PPM interrogation today.  She has valvular afib with prior TAVR.  She has multiple CVA risk factors including prior stroke. (annual risk is 8%/year)  I therefore recommend long term anticoagulation with coumadin. Start coumadin 2.5mg  daily today.  Follow-up with Dr Sherril Croon on Monday to establish in his coumadin clinic. Stop ASA once INR >2.  3. HTN Stable No change required today  Return to device clinic in 6 months

## 2012-07-09 NOTE — Addendum Note (Signed)
Addended by: Lesle Chris on: 07/09/2012 11:31 AM   Modules accepted: Orders

## 2012-08-05 ENCOUNTER — Encounter: Payer: Self-pay | Admitting: Cardiology

## 2012-08-05 ENCOUNTER — Ambulatory Visit (INDEPENDENT_AMBULATORY_CARE_PROVIDER_SITE_OTHER): Payer: Medicare Other | Admitting: Cardiology

## 2012-08-05 VITALS — BP 135/68 | HR 75 | Ht 60.0 in | Wt 128.8 lb

## 2012-08-05 DIAGNOSIS — I4891 Unspecified atrial fibrillation: Secondary | ICD-10-CM

## 2012-08-05 DIAGNOSIS — I4949 Other premature depolarization: Secondary | ICD-10-CM

## 2012-08-05 DIAGNOSIS — I350 Nonrheumatic aortic (valve) stenosis: Secondary | ICD-10-CM

## 2012-08-05 DIAGNOSIS — I359 Nonrheumatic aortic valve disorder, unspecified: Secondary | ICD-10-CM

## 2012-08-05 DIAGNOSIS — I871 Compression of vein: Secondary | ICD-10-CM

## 2012-08-05 NOTE — Patient Instructions (Addendum)

## 2012-08-05 NOTE — Progress Notes (Signed)
HPI Patient presents for followup of aortic stenosis status post percutaneous valve replacement. She has no history of coronary disease. She had her valve replacement at [facility] in 2011. She is followed routinely and her. A followup Doppler that I have here demonstrates no residual gradient or insufficiency of any significance. She used to have symptoms of abdominal and chest fullness and shortness of breath and these are much improved. She lives alone and does some daily chores. The patient denies any new symptoms such as chest discomfort, neck or arm discomfort. There has been no new shortness of breath, PND or orthopnea. There have been no reported palpitations, presyncope or syncope.  Allergies  Allergen Reactions  . Ace Inhibitors     REACTION: swelling    Current Outpatient Prescriptions  Medication Sig Dispense Refill  . acetaminophen (TYLENOL) 325 MG tablet Take 650 mg by mouth every 6 (six) hours as needed.        Marland Kitchen albuterol (PROVENTIL) (2.5 MG/3ML) 0.083% nebulizer solution Take 2.5 mg by nebulization 3 (three) times daily. UAD      . albuterol (VENTOLIN HFA) 108 (90 BASE) MCG/ACT inhaler Inhale 2 puffs into the lungs every 6 (six) hours as needed. UAD.       Marland Kitchen amLODipine (NORVASC) 10 MG tablet Take 10 mg by mouth daily.        . Artificial Tear Ointment (EYE LUBRICANT OP) Place 2 drops into both eyes 3 (three) times daily.       Marland Kitchen aspirin 81 MG chewable tablet Chew 81 mg by mouth daily.        . Calcium Carbonate-Vitamin D (CALTRATE 600+D) 600-400 MG-UNIT per tablet Take 1 tablet by mouth 2 (two) times daily.        . cilostazol (PLETAL) 100 MG tablet Take 1 tablet (100 mg total) by mouth 2 (two) times daily.  60 tablet  6  . clonazePAM (KLONOPIN) 0.5 MG tablet Take 0.5 mg by mouth at bedtime.      . cloNIDine (CATAPRES - DOSED IN MG/24 HR) 0.1 mg/24hr patch Place 1 patch onto the skin once a week.        . Ferrous Sulfate (IRON) 325 (65 FE) MG TABS Take 1 tablet by mouth 2 (two)  times daily.        . Fluticasone-Salmeterol (ADVAIR DISKUS) 250-50 MCG/DOSE AEPB Inhale 1 puff into the lungs 2 (two) times daily.        . isosorbide mononitrate (IMDUR) 30 MG 24 hr tablet Take 1 tablet (30 mg total) by mouth daily.  30 tablet  6  . metoprolol (LOPRESSOR) 50 MG tablet Take 50 mg by mouth daily.       . Multiple Vitamin (MULTIVITAMIN) tablet Take 1 tablet by mouth daily.        . nitroGLYCERIN (NITROSTAT) 0.4 MG SL tablet Place 1 tablet (0.4 mg total) under the tongue every 5 (five) minutes as needed.  25 tablet  3  . pantoprazole (PROTONIX) 40 MG tablet Take 40 mg by mouth 2 (two) times daily.        Marland Kitchen senna (SENOKOT) 8.6 MG tablet Take 2 tablets by mouth daily as needed.       . tiotropium (SPIRIVA) 18 MCG inhalation capsule Place 18 mcg into inhaler and inhale daily.        . traMADol (ULTRAM) 50 MG tablet Take 50 mg by mouth every 8 (eight) hours as needed.       . warfarin (COUMADIN) 2.5  MG tablet Take 1 tablet (2.5 mg total) by mouth every evening.  30 tablet  1   No current facility-administered medications for this visit.    Past Medical History  Diagnosis Date  . Aortic stenosis     s/p TAVR with normal coronaries.  . DM2 (diabetes mellitus, type 2)   . Asthma   . HTN (hypertension)   . Gastroenteritis     Hx of it. last admission was 06/01/05  . Ejection fraction < 50%     echocardiogram in 08 showed preserved LV function w/EF of 55-60%.   . Diastolic heart failure     chronic  . Atypical chest pain   . COPD (chronic obstructive pulmonary disease) with emphysema   . PAD (peripheral artery disease)   . Cerebrovascular disease     noncritical   . Pacemaker     status post pacemaker after TAVR 6 2011 at [facility]  . Subclavian vein stenosis     both innominate vein and left subclavian vein stenosis secondary to PPM implantation- incidental finding neck CT scan.  . Nodule of neck     neagtive CT scan neck for malingnancy.     No past surgical  history on file.  ROS:  As stated in the HPI and negative for all other systems.  PHYSICAL EXAM BP 135/68  Pulse 75  Ht 5' (1.524 m)  Wt 128 lb 12.8 oz (58.423 kg)  BMI 25.15 kg/m2 GENERAL:  Well appearing HEENT:  Pupils equal round and reactive, fundi not visualized, oral mucosa unremarkable NECK:  No jugular venous distention, waveform within normal limits, carotid upstroke brisk and symmetric, radiated murmur to the left carotid, no bruits, no thyromegaly LUNGS:  Clear to auscultation bilaterally CHEST:  Unremarkable HEART:  PMI not displaced or sustained,S1 and S2 within normal limits, no S3, no S4, no clicks, no rubs, soft apical systolic murmur ABD:  Flat, positive bowel sounds normal in frequency in pitch, no bruits, no rebound, no guarding, no midline pulsatile mass, no hepatomegaly, no splenomegaly EXT:  2 plus pulses throughout, no edema, no cyanosis no clubbing    EKG:  NSR, rate 75, LBBB, LAD.    ASSESSMENT AND PLAN  AORTIC STENOSIS/TAVR:  She as extremely well s/p TAVR.  I looked at a 2012 echo which looked good.  She has yearly follow up at [facility].  No further imaging or change in therapy is indicated.   HTN:   The blood pressure is at target. No change in medications is indicated. We will continue with therapeutic lifestyle changes (TLC).  PACEMAKER:  She is up to date with follow up.

## 2012-11-02 ENCOUNTER — Other Ambulatory Visit: Payer: Self-pay | Admitting: Cardiovascular Disease

## 2012-11-03 ENCOUNTER — Other Ambulatory Visit: Payer: Self-pay | Admitting: Cardiology

## 2012-11-03 MED ORDER — CILOSTAZOL 100 MG PO TABS: 100.0000 mg | ORAL_TABLET | Freq: Two times a day (BID) | ORAL | Status: DC

## 2013-01-16 ENCOUNTER — Encounter: Payer: Self-pay | Admitting: Internal Medicine

## 2013-01-16 ENCOUNTER — Ambulatory Visit (INDEPENDENT_AMBULATORY_CARE_PROVIDER_SITE_OTHER): Payer: Medicare Other | Admitting: Internal Medicine

## 2013-01-16 VITALS — BP 137/68 | HR 79 | Ht 60.0 in | Wt 122.0 lb

## 2013-01-16 DIAGNOSIS — I4891 Unspecified atrial fibrillation: Secondary | ICD-10-CM

## 2013-01-16 DIAGNOSIS — Z950 Presence of cardiac pacemaker: Secondary | ICD-10-CM

## 2013-01-16 DIAGNOSIS — I442 Atrioventricular block, complete: Secondary | ICD-10-CM

## 2013-01-16 DIAGNOSIS — I10 Essential (primary) hypertension: Secondary | ICD-10-CM

## 2013-01-16 LAB — PACEMAKER DEVICE OBSERVATION
AL IMPEDENCE PM: 538 Ohm
BATTERY VOLTAGE: 2.8 V
RV LEAD AMPLITUDE: 31.36 mv
VENTRICULAR PACING PM: 1

## 2013-01-16 NOTE — Patient Instructions (Addendum)
   Call [employer] - [phone] - to request an application for a cell adapter.   Continue all current medications. Remote check - 04/20/2013 Your physician wants you to follow up in:  1 year.  You will receive a reminder letter in the mail one-two months in advance.  If you don't receive a letter, please call our office to schedule the follow up appointment - Dr. Johney Frame

## 2013-01-16 NOTE — Progress Notes (Signed)
PCP: Ignatius Specking., MD Primary Cardiologist: Karmel Patricelli is a 79 y.o. female who presents today for routine electrophysiology followup.  Since last being seen in our clinic, the patient reports doing very well.  Today, she denies symptoms of palpitations, chest pain, shortness of breath,  lower extremity edema, dizziness, presyncope, or syncope.  The patient is otherwise without complaint today.   Past Medical History  Diagnosis Date  . Aortic stenosis     s/p TAVR with normal coronaries.  . DM2 (diabetes mellitus, type 2)   . Asthma   . HTN (hypertension)   . Gastroenteritis     Hx of it. last admission was 06/01/05  . Ejection fraction < 50%     echocardiogram in 08 showed preserved LV function w/EF of 55-60%.   . Diastolic heart failure     chronic  . Atypical chest pain   . COPD (chronic obstructive pulmonary disease) with emphysema   . PAD (peripheral artery disease)   . Cerebrovascular disease     noncritical   . Pacemaker     status post pacemaker after TAVR 6 2011 at [facility]  . Subclavian vein stenosis     both innominate vein and left subclavian vein stenosis secondary to PPM implantation- incidental finding neck CT scan.  . Nodule of neck     neagtive CT scan neck for malingnancy.    No past surgical history on file.  Current Outpatient Prescriptions  Medication Sig Dispense Refill  . acetaminophen (TYLENOL) 325 MG tablet Take 650 mg by mouth every 6 (six) hours as needed.        Marland Kitchen albuterol (PROVENTIL) (2.5 MG/3ML) 0.083% nebulizer solution Take 2.5 mg by nebulization 3 (three) times daily. UAD      . albuterol (VENTOLIN HFA) 108 (90 BASE) MCG/ACT inhaler Inhale 2 puffs into the lungs every 6 (six) hours as needed. UAD.       Marland Kitchen amLODipine (NORVASC) 10 MG tablet Take 10 mg by mouth daily.        . Artificial Tear Ointment (EYE LUBRICANT OP) Place 2 drops into both eyes 3 (three) times daily.       Marland Kitchen aspirin 81 MG chewable tablet Chew 81 mg by mouth  daily.        . Calcium Carbonate-Vitamin D (CALTRATE 600+D) 600-400 MG-UNIT per tablet Take 1 tablet by mouth 2 (two) times daily.        . cilostazol (PLETAL) 100 MG tablet Take 1 tablet (100 mg total) by mouth 2 (two) times daily.  60 tablet  6  . clonazePAM (KLONOPIN) 0.5 MG tablet Take 0.5 mg by mouth at bedtime.      . cloNIDine (CATAPRES - DOSED IN MG/24 HR) 0.1 mg/24hr patch Place 1 patch onto the skin once a week.        . Ferrous Sulfate (IRON) 325 (65 FE) MG TABS Take 1 tablet by mouth 2 (two) times daily.        . Fluticasone-Salmeterol (ADVAIR DISKUS) 250-50 MCG/DOSE AEPB Inhale 1 puff into the lungs 2 (two) times daily.        . isosorbide mononitrate (IMDUR) 30 MG 24 hr tablet Take 1 tablet (30 mg total) by mouth daily.  30 tablet  6  . metoprolol (LOPRESSOR) 50 MG tablet Take 50 mg by mouth daily.       . Multiple Vitamin (MULTIVITAMIN) tablet Take 1 tablet by mouth daily.        Marland Kitchen  nitroGLYCERIN (NITROSTAT) 0.4 MG SL tablet Place 1 tablet (0.4 mg total) under the tongue every 5 (five) minutes as needed.  25 tablet  3  . pantoprazole (PROTONIX) 40 MG tablet Take 40 mg by mouth 2 (two) times daily.        Marland Kitchen senna (SENOKOT) 8.6 MG tablet Take 2 tablets by mouth daily as needed.       . tiotropium (SPIRIVA) 18 MCG inhalation capsule Place 18 mcg into inhaler and inhale daily.        . traMADol (ULTRAM) 50 MG tablet Take 50 mg by mouth every 8 (eight) hours as needed.       . warfarin (COUMADIN) 2.5 MG tablet Take 1 tablet (2.5 mg total) by mouth every evening.  30 tablet  1   No current facility-administered medications for this visit.    Physical Exam: Filed Vitals:   01/16/13 1055  BP: 137/68  Pulse: 79  Height: 5' (1.524 m)  Weight: 122 lb (55.339 kg)    GEN- The patient is well appearing, alert and oriented x 3 today.   Head- normocephalic, atraumatic Eyes-  Sclera clear, conjunctiva pink Ears- hearing intact Oropharynx- clear Lungs- Clear to ausculation bilaterally,  normal work of breathing Chest- pacemaker pocket is well healed Heart- Regular rate and rhythm, no murmurs, rubs or gallops, PMI not laterally displaced GI- soft, NT, ND, + BS Extremities- no clubbing, cyanosis, or edema  Pacemaker interrogation- reviewed in detail today,  See PACEART report  Assessment and Plan:  1. Mobitz II AV block Normal pacemaker function See Pace Art report No changes today  2.  afib- low afib burden Continue coumadin long term  3.  HTN Stable No change required today  Enroll in [facility] Return in 1 year

## 2013-01-21 ENCOUNTER — Telehealth: Payer: Self-pay | Admitting: *Deleted

## 2013-01-21 NOTE — Telephone Encounter (Signed)
Stated she is having trouble with the phone number we gave for her to call about device adapter.  She was given:  [phone].  Please confirm that above number is correct.

## 2013-01-23 NOTE — Telephone Encounter (Signed)
This is correct # for M [employer]. [id number]

## 2013-02-11 ENCOUNTER — Ambulatory Visit (INDEPENDENT_AMBULATORY_CARE_PROVIDER_SITE_OTHER): Payer: Medicare Other | Admitting: Cardiology

## 2013-02-11 ENCOUNTER — Encounter: Payer: Self-pay | Admitting: Cardiology

## 2013-02-11 VITALS — BP 150/75 | HR 90 | Ht 60.0 in | Wt 120.0 lb

## 2013-02-11 DIAGNOSIS — I340 Nonrheumatic mitral (valve) insufficiency: Secondary | ICD-10-CM

## 2013-02-11 DIAGNOSIS — I059 Rheumatic mitral valve disease, unspecified: Secondary | ICD-10-CM

## 2013-02-11 DIAGNOSIS — I350 Nonrheumatic aortic (valve) stenosis: Secondary | ICD-10-CM

## 2013-02-11 DIAGNOSIS — I359 Nonrheumatic aortic valve disorder, unspecified: Secondary | ICD-10-CM

## 2013-02-11 DIAGNOSIS — R0989 Other specified symptoms and signs involving the circulatory and respiratory systems: Secondary | ICD-10-CM

## 2013-02-11 NOTE — Patient Instructions (Signed)
Your physician has requested that you have an echocardiogram. Echocardiography is a painless test that uses sound waves to create images of your heart. It provides your doctor with information about the size and shape of your heart and how well your heart's chambers and valves are working. This procedure takes approximately one hour. There are no restrictions for this procedure. Your physician has requested that you have a carotid duplex. This test is an ultrasound of the carotid arteries in your neck. It looks at blood flow through these arteries that supply the brain with blood. Allow one hour for this exam. There are no restrictions or special instructions. Office will contact with results via phone or letter.   Continue all current medications. Your physician wants you to follow up in:  4 months.  You will receive a reminder letter in the mail one-two months in advance.  If you don't receive a letter, please call our office to schedule the follow up appointment

## 2013-02-11 NOTE — Progress Notes (Signed)
Clinical Summary [name] is a 79 y.o.female  1. Aortic stenosis - s/p TAVR at [facility] in 2011, last echo 2012 showed no significant gradient or regurgitation  - denies any SOB, no chest pain, no presyncope or syncope. Fairly sedentary lifestyle, able to complete her normal level of activity without troubles. No orthopnea, no PND, no edema.  2. HTN - checks bp at home 2-3 times per week. Typically 120s/60s.  - compliant with medications  3. Permanent pacemaker placement - placed after TAVR in 2011 - followed in clinic, normal pacemaker function 12/2012 - denies any lightheadedness, dizziness, or fatigue  4. Afib - denies any palpitations - on coumadin, no bleeding problems.  Past Medical History  Diagnosis Date  . Aortic stenosis     s/p TAVR with normal coronaries.  . DM2 (diabetes mellitus, type 2)   . Asthma   . HTN (hypertension)   . Gastroenteritis     Hx of it. last admission was 06/01/05  . Ejection fraction < 50%     echocardiogram in 08 showed preserved LV function w/EF of 55-60%.   . Diastolic heart failure     chronic  . Atypical chest pain   . COPD (chronic obstructive pulmonary disease) with emphysema   . PAD (peripheral artery disease)   . Cerebrovascular disease     noncritical   . Pacemaker     status post pacemaker after TAVR 6 2011 at [facility]  . Subclavian vein stenosis     both innominate vein and left subclavian vein stenosis secondary to PPM implantation- incidental finding neck CT scan.  . Nodule of neck     neagtive CT scan neck for malingnancy.      Allergies  Allergen Reactions  . Ace Inhibitors     REACTION: swelling     Current Outpatient Prescriptions  Medication Sig Dispense Refill  . acetaminophen (TYLENOL) 325 MG tablet Take 650 mg by mouth every 6 (six) hours as needed.        Marland Kitchen albuterol (PROVENTIL) (2.5 MG/3ML) 0.083% nebulizer solution Take 2.5 mg by nebulization 3 (three) times daily. UAD      . albuterol  (VENTOLIN HFA) 108 (90 BASE) MCG/ACT inhaler Inhale 2 puffs into the lungs every 6 (six) hours as needed. UAD.       Marland Kitchen amLODipine (NORVASC) 10 MG tablet Take 10 mg by mouth daily.        . Artificial Tear Ointment (EYE LUBRICANT OP) Place 2 drops into both eyes 3 (three) times daily.       Marland Kitchen aspirin 81 MG chewable tablet Chew 81 mg by mouth daily.        . Calcium Carbonate-Vitamin D (CALTRATE 600+D) 600-400 MG-UNIT per tablet Take 1 tablet by mouth 2 (two) times daily.        . cilostazol (PLETAL) 100 MG tablet Take 1 tablet (100 mg total) by mouth 2 (two) times daily.  60 tablet  6  . clonazePAM (KLONOPIN) 0.5 MG tablet Take 0.5 mg by mouth at bedtime.      . cloNIDine (CATAPRES - DOSED IN MG/24 HR) 0.1 mg/24hr patch Place 1 patch onto the skin once a week.        . Ferrous Sulfate (IRON) 325 (65 FE) MG TABS Take 1 tablet by mouth 2 (two) times daily.        . Fluticasone-Salmeterol (ADVAIR DISKUS) 250-50 MCG/DOSE AEPB Inhale 1 puff into the lungs 2 (two) times daily.        Marland Kitchen  isosorbide mononitrate (IMDUR) 30 MG 24 hr tablet Take 1 tablet (30 mg total) by mouth daily.  30 tablet  6  . metoprolol (LOPRESSOR) 50 MG tablet Take 50 mg by mouth daily.       . Multiple Vitamin (MULTIVITAMIN) tablet Take 1 tablet by mouth daily.        . nitroGLYCERIN (NITROSTAT) 0.4 MG SL tablet Place 1 tablet (0.4 mg total) under the tongue every 5 (five) minutes as needed.  25 tablet  3  . pantoprazole (PROTONIX) 40 MG tablet Take 40 mg by mouth 2 (two) times daily.        Marland Kitchen senna (SENOKOT) 8.6 MG tablet Take 2 tablets by mouth daily as needed.       . tiotropium (SPIRIVA) 18 MCG inhalation capsule Place 18 mcg into inhaler and inhale daily.        . traMADol (ULTRAM) 50 MG tablet Take 50 mg by mouth every 8 (eight) hours as needed.       . warfarin (COUMADIN) 2.5 MG tablet Take 1 tablet (2.5 mg total) by mouth every evening.  30 tablet  1   No current facility-administered medications for this visit.     No  past surgical history on file.   Allergies  Allergen Reactions  . Ace Inhibitors     REACTION: swelling      Family History  Problem Relation Age of Onset  . Hypertension Neg Hx   . Diabetes Neg Hx   . Coronary artery disease Neg Hx      Social History Ms. Bissette reports that she has never smoked. She has never used smokeless tobacco. Ms. Currie reports that she does not drink alcohol.   Review of Systems CONSTITUTIONAL: No weight loss, fever, chills, weakness or fatigue.  HEENT: Eyes: No visual loss, blurred vision, double vision or yellow sclerae.No hearing loss, sneezing, congestion, runny nose or sore throat.  SKIN: No rash or itching.  CARDIOVASCULAR: per HPI RESPIRATORY: per HPI  GASTROINTESTINAL: No anorexia, nausea, vomiting or diarrhea. No abdominal pain or blood.  GENITOURINARY: No burning on urination, no polyuria NEUROLOGICAL: No headache, dizziness, syncope, paralysis, ataxia, numbness or tingling in the extremities. No change in bowel or bladder control.  MUSCULOSKELETAL: No muscle, back pain, joint pain or stiffness.  LYMPHATICS: No enlarged nodes. No history of splenectomy.  PSYCHIATRIC: No history of depression or anxiety.  ENDOCRINOLOGIC: No reports of sweating, cold or heat intolerance. No polyuria or polydipsia.  Marland Kitchen   Physical Examination p 90 bp 150/75 Wt 120 lbs BMI 23 Gen: resting comfortably, no acute distress HEENT: no scleral icterus, pupils equal round and reactive, no palptable cervical adenopathy,  CV: RRR, 2/6 systolic murmur at apex, no JVD, no carotid bruits. Right sided carotid bruit Resp: Clear to auscultation bilaterally GI: abdomen is soft, non-tender, non-distended, normal bowel sounds, no hepatosplenomegaly MSK: extremities are warm, no edema.  Skin: warm, no rash Neuro:  no focal deficits Psych: appropriate affect   Diagnostic Studies  Echo 08/2010: LVEF 55-60%, grade I diastolic dysfunction, mild LAE, AV with mean grad 10 mmHg  and no regurgitation, mild to mod MR, mild mitral stenosis with reported mean gradient of 10 mmHg   Assessment and Plan  1. Aortic stenosis - s/p TAVR in 2011 - denies any current symptoms - continue to follow clinically  2. HTN - elevated in clinic, home numbers are at goal - no change in therapy, I have asked her to bring a bp log with  her next visit  3. Pacemaker placement - no current symptoms - continue regular follow up in EP clinic, last check showed normal function  4. Carotid bruit - no neurlogical symptoms - will obtain a carotid US to further evaluate  5. Mitral regurgitation - mild to moderate on last echo 2 years ago, also described a MV mean gradient of 10. Unclear if related to MR and increased flow across the valve or if there is true mitral stenosis - repeat echo to eval MV and chamber sizes  6. Afib - denies any symptoms - continue rate control, continue coumadin.     Antoine Poche, M.D., F.A.C.C.

## 2013-02-18 ENCOUNTER — Encounter (INDEPENDENT_AMBULATORY_CARE_PROVIDER_SITE_OTHER): Payer: Medicare Other

## 2013-02-18 DIAGNOSIS — I6529 Occlusion and stenosis of unspecified carotid artery: Secondary | ICD-10-CM

## 2013-02-18 DIAGNOSIS — R0989 Other specified symptoms and signs involving the circulatory and respiratory systems: Secondary | ICD-10-CM

## 2013-02-20 ENCOUNTER — Telehealth: Payer: Self-pay | Admitting: Cardiology

## 2013-02-20 NOTE — Telephone Encounter (Signed)
Pt informed of results.

## 2013-02-20 NOTE — Telephone Encounter (Signed)
Message copied by Burnice Logan on Fri Feb 20, 2013  3:45 PM ------      Message from: [place] F      Created: Fri Feb 20, 2013  2:12 PM       Please let patient know that there are no significant blockages in the arteries in the neck ------

## 2013-02-25 ENCOUNTER — Other Ambulatory Visit: Payer: Self-pay

## 2013-02-25 ENCOUNTER — Other Ambulatory Visit (INDEPENDENT_AMBULATORY_CARE_PROVIDER_SITE_OTHER): Payer: Medicare Other

## 2013-02-25 DIAGNOSIS — I359 Nonrheumatic aortic valve disorder, unspecified: Secondary | ICD-10-CM

## 2013-02-25 DIAGNOSIS — I340 Nonrheumatic mitral (valve) insufficiency: Secondary | ICD-10-CM

## 2013-02-25 DIAGNOSIS — I059 Rheumatic mitral valve disease, unspecified: Secondary | ICD-10-CM

## 2013-03-03 ENCOUNTER — Telehealth: Payer: Self-pay | Admitting: Cardiology

## 2013-03-03 NOTE — Telephone Encounter (Signed)
Message copied by Burnice Logan on Tue Mar 03, 2013  4:13 PM ------      Message from: [place] F      Created: Mon Mar 02, 2013  3:32 PM       Please let patient know that his echo showed that his replaced aortic valve is functioning normally. His mitral valve appears to be moderately thickened, we will continue to monitor it over the next few years, there is nothing to do about it now and may not need to do anything about it in the future. ------

## 2013-03-03 NOTE — Telephone Encounter (Signed)
Pt informed of results.

## 2013-03-23 ENCOUNTER — Telehealth: Payer: Self-pay | Admitting: Internal Medicine

## 2013-03-23 NOTE — Telephone Encounter (Signed)
Patient requesting  800# for medtronic m-link to apply for cell adapter.

## 2013-03-23 NOTE — Telephone Encounter (Signed)
Patient needs help with paperwork about her device.

## 2013-04-20 ENCOUNTER — Encounter: Payer: Medicare Other | Admitting: *Deleted

## 2013-04-28 ENCOUNTER — Telehealth: Payer: Self-pay | Admitting: Internal Medicine

## 2013-04-28 NOTE — Telephone Encounter (Signed)
New problem    Pt needs a call back about her transmition. Pt just got the machine 12/29 fully up. Needs help.

## 2013-04-29 NOTE — Telephone Encounter (Signed)
Pt's granddaughter is a Engineer, civil (consulting), she helped [name] set up her home monitor. Pt will send a test transmission tomorrow. I let her know if we find any episodes or abnormalities we will call her. If she does not hear from [place], then her transmission was good.

## 2013-05-07 ENCOUNTER — Encounter: Payer: Self-pay | Admitting: *Deleted

## 2013-05-26 ENCOUNTER — Ambulatory Visit (INDEPENDENT_AMBULATORY_CARE_PROVIDER_SITE_OTHER): Payer: Medicare Other | Admitting: *Deleted

## 2013-05-26 ENCOUNTER — Encounter: Payer: Self-pay | Admitting: Internal Medicine

## 2013-05-26 DIAGNOSIS — I4891 Unspecified atrial fibrillation: Secondary | ICD-10-CM

## 2013-05-26 DIAGNOSIS — I442 Atrioventricular block, complete: Secondary | ICD-10-CM

## 2013-05-26 DIAGNOSIS — Z950 Presence of cardiac pacemaker: Secondary | ICD-10-CM

## 2013-05-27 LAB — MDC_IDC_ENUM_SESS_TYPE_REMOTE
Battery Impedance: 504 Ohm
Battery Remaining Longevity: 82 mo
Battery Voltage: 2.79 V
Brady Statistic AP VP Percent: 22 %
Brady Statistic AP VS Percent: 0 %
Brady Statistic AS VS Percent: 0 %
Date Time Interrogation Session: 20150128113843
Lead Channel Impedance Value: 521 Ohm
Lead Channel Impedance Value: 610 Ohm
Lead Channel Pacing Threshold Amplitude: 0.75 V
Lead Channel Pacing Threshold Pulse Width: 0.4 ms
Lead Channel Pacing Threshold Pulse Width: 0.4 ms
Lead Channel Sensing Intrinsic Amplitude: 4 mV
Lead Channel Setting Pacing Amplitude: 2.5 V
MDC IDC MSMT LEADCHNL RV PACING THRESHOLD AMPLITUDE: 0.75 V
MDC IDC MSMT LEADCHNL RV SENSING INTR AMPL: 16 mV
MDC IDC SET LEADCHNL RA PACING AMPLITUDE: 2 V
MDC IDC SET LEADCHNL RV PACING PULSEWIDTH: 0.4 ms
MDC IDC SET LEADCHNL RV SENSING SENSITIVITY: 5.6 mV
MDC IDC STAT BRADY AS VP PERCENT: 78 %

## 2013-06-01 ENCOUNTER — Encounter: Payer: Self-pay | Admitting: *Deleted

## 2013-06-05 ENCOUNTER — Encounter: Payer: Self-pay | Admitting: Cardiology

## 2013-06-05 ENCOUNTER — Ambulatory Visit (INDEPENDENT_AMBULATORY_CARE_PROVIDER_SITE_OTHER): Payer: Medicare Other | Admitting: Cardiology

## 2013-06-05 VITALS — BP 149/72 | HR 81 | Ht 62.0 in | Wt 124.8 lb

## 2013-06-05 DIAGNOSIS — I350 Nonrheumatic aortic (valve) stenosis: Secondary | ICD-10-CM

## 2013-06-05 DIAGNOSIS — I4891 Unspecified atrial fibrillation: Secondary | ICD-10-CM

## 2013-06-05 DIAGNOSIS — I050 Rheumatic mitral stenosis: Secondary | ICD-10-CM

## 2013-06-05 DIAGNOSIS — I359 Nonrheumatic aortic valve disorder, unspecified: Secondary | ICD-10-CM

## 2013-06-05 DIAGNOSIS — I10 Essential (primary) hypertension: Secondary | ICD-10-CM

## 2013-06-05 NOTE — Progress Notes (Signed)
Clinical Summary [name] is a 80 y.o.female seen today for follow up of the following medical problems.  1. Aortic stenosis  - s/p TAVR at [facility] in 2011, last echo 01/2013 showed no significant gradient or regurgitation  - denies any SOB, no chest pain, no presyncope or syncope. Fairly sedentary lifestyle, able to complete her normal level of activity without troubles. No orthopnea, no PND, no edema.   2. HTN  - not checking bp regularly - compliant with medications   3. Permanent pacemaker placement  - placed after TAVR in 2011  - followed in clinic, normal pacemaker function 04/2013 by remote check - denies any lightheadedness, dizziness, or fatigue   4. Afib  - denies any palpitations  - on coumadin, no bleeding problems.  5. Mitral stenosis - repeat echo 01/2013 with valve area by continuity 1.35, mean gradient 11 mmHg. Moderate to severe. PASP 43 mmHg.   Past Medical History  Diagnosis Date  . Aortic stenosis     s/p TAVR with normal coronaries.  . DM2 (diabetes mellitus, type 2)   . Asthma   . HTN (hypertension)   . Gastroenteritis     Hx of it. last admission was 06/01/05  . Ejection fraction < 50%     echocardiogram in 08 showed preserved LV function w/EF of 55-60%.   . Diastolic heart failure     chronic  . Atypical chest pain   . COPD (chronic obstructive pulmonary disease) with emphysema   . PAD (peripheral artery disease)   . Cerebrovascular disease     noncritical   . Pacemaker     status post pacemaker after TAVR 6 2011 at [facility]  . Subclavian vein stenosis     both innominate vein and left subclavian vein stenosis secondary to PPM implantation- incidental finding neck CT scan.  . Nodule of neck     neagtive CT scan neck for malingnancy.      Allergies  Allergen Reactions  . Ace Inhibitors     REACTION: swelling     Current Outpatient Prescriptions  Medication Sig Dispense Refill  . acetaminophen (TYLENOL) 325 MG tablet Take 650  mg by mouth every 6 (six) hours as needed.        Marland Kitchen albuterol (PROVENTIL) (2.5 MG/3ML) 0.083% nebulizer solution Take 2.5 mg by nebulization 3 (three) times daily. UAD      . albuterol (VENTOLIN HFA) 108 (90 BASE) MCG/ACT inhaler Inhale 2 puffs into the lungs every 6 (six) hours as needed. UAD.       Marland Kitchen amLODipine (NORVASC) 10 MG tablet Take 10 mg by mouth daily.        . Artificial Tear Ointment (EYE LUBRICANT OP) Place 2 drops into both eyes 3 (three) times daily.       Marland Kitchen aspirin 81 MG chewable tablet Chew 81 mg by mouth daily.        . Calcium Carbonate-Vitamin D (CALTRATE 600+D) 600-400 MG-UNIT per tablet Take 1 tablet by mouth 2 (two) times daily.        . cilostazol (PLETAL) 100 MG tablet Take 1 tablet (100 mg total) by mouth 2 (two) times daily.  60 tablet  6  . clonazePAM (KLONOPIN) 0.5 MG tablet Take 0.5 mg by mouth at bedtime.      . cloNIDine (CATAPRES - DOSED IN MG/24 HR) 0.1 mg/24hr patch Place 1 patch onto the skin once a week.        . clotrimazole-betamethasone (LOTRISONE)  cream Apply 1 application topically 2 (two) times daily.      . Ferrous Sulfate (IRON) 325 (65 FE) MG TABS Take 1 tablet by mouth 2 (two) times daily.        . Fluticasone-Salmeterol (ADVAIR DISKUS) 250-50 MCG/DOSE AEPB Inhale 1 puff into the lungs 2 (two) times daily.        . isosorbide mononitrate (IMDUR) 30 MG 24 hr tablet Take 30 mg by mouth daily.      . metoprolol succinate (TOPROL-XL) 50 MG 24 hr tablet Take 1 tablet by mouth daily.      . Multiple Vitamin (MULTIVITAMIN) tablet Take 1 tablet by mouth daily.        . nitroGLYCERIN (NITROSTAT) 0.4 MG SL tablet Place 1 tablet (0.4 mg total) under the tongue every 5 (five) minutes as needed.  25 tablet  3  . pantoprazole (PROTONIX) 40 MG tablet Take 40 mg by mouth 2 (two) times daily.        Marland Kitchen. senna (SENOKOT) 8.6 MG tablet Take 2 tablets by mouth daily as needed.       . tiotropium (SPIRIVA) 18 MCG inhalation capsule Place 18 mcg into inhaler and inhale daily.         . traMADol (ULTRAM) 50 MG tablet Take 50 mg by mouth every 8 (eight) hours as needed.       . warfarin (COUMADIN) 2.5 MG tablet Take 1 tablet (2.5 mg total) by mouth every evening.  30 tablet  1   No current facility-administered medications for this visit.     No past surgical history on file.   Allergies  Allergen Reactions  . Ace Inhibitors     REACTION: swelling      Family History  Problem Relation Age of Onset  . Hypertension Neg Hx   . Diabetes Neg Hx   . Coronary artery disease Neg Hx      Social History [name] reports that she has never smoked. She has never used smokeless tobacco. [name] reports that she does not drink alcohol.   Review of Systems CONSTITUTIONAL: No weight loss, fever, chills, weakness or fatigue.  HEENT: Eyes: No visual loss, blurred vision, double vision or yellow sclerae.No hearing loss, sneezing, congestion, runny nose or sore throat.  SKIN: No rash or itching.  CARDIOVASCULAR: per HPI RESPIRATORY: No shortness of breath, cough or sputum.  GASTROINTESTINAL: No anorexia, nausea, vomiting or diarrhea. No abdominal pain or blood.  GENITOURINARY: No burning on urination, no polyuria NEUROLOGICAL: No headache, dizziness, syncope, paralysis, ataxia, numbness or tingling in the extremities. No change in bowel or bladder control.  MUSCULOSKELETAL: No muscle, back pain, joint pain or stiffness.  LYMPHATICS: No enlarged nodes. No history of splenectomy.  PSYCHIATRIC: No history of depression or anxiety.  ENDOCRINOLOGIC: No reports of sweating, cold or heat intolerance. No polyuria or polydipsia.  Marland Kitchen.   Physical Examination p81 bp 149/72 Wt 124 lbs BMI 23 Gen: resting comfortably, no acute distress HEENT: no scleral icterus, pupils equal round and reactive, no palptable cervical adenopathy,  CV: RRR, prominent S2, no m/r/g, no JVD, right carotid bruit Resp: Clear to auscultation bilaterally GI: abdomen is soft, non-tender,  non-distended, normal bowel sounds, no hepatosplenomegaly MSK: extremities are warm, no edema.  Skin: warm, no rash Neuro:  no focal deficits Psych: appropriate affect   Diagnostic Studies Echo 08/2010: LVEF 55-60%, grade I diastolic dysfunction, mild LAE, AV with mean grad 10 mmHg and no regurgitation, mild to mod MR, mild  mitral stenosis with reported mean gradient of 10 mmHg  01/2013 Echo LVEF 70%, moderate to severe MR, normal functioning AVR  01/2013 Carotid US 1-39% bilateral stenosis, patent antegrade vertebrals    Assessment and Plan  1. Aortic stenosis  - s/p TAVR in 2011  - denies any current symptoms. Normal function by recent echo. - continue to follow clinically   2. HTN  - at goal based on her age - will stop imdur as she has no indication for it  3. Pacemaker placement  - no current symptoms  - continue regular follow up in EP clinic, last check showed normal function   4. Carotid bruit  - no neurlogical symptoms  - no significant stenosis by recent US  5. Mitral stenosis - moderate to severe on most recent echo - she denies any significant symptoms - continue to follow clinically, given her age, lack of symptoms, and borderline severity will not puruse any form of intervention at this time.   6. Afib  - denies any symptoms  - continue rate control, continue coumadin.     Follow up 6 months   Antoine Poche, M.D., F.A.C.C.

## 2013-06-05 NOTE — Patient Instructions (Signed)
Your physician recommends that you schedule a follow-up appointment in: 6 months with Dr. Wyline MoodBranch. You should receive a letter in the mail in 4 months. If you do not receive this letter by June 2015 call our office to schedule this appointment.   Your physician has recommended you make the following change in your medication:  Stop: Isosorbide Mononitrate (IMDUR)  Continue all other medications the same.

## 2013-07-01 ENCOUNTER — Other Ambulatory Visit: Payer: Self-pay | Admitting: Cardiology

## 2013-07-01 MED ORDER — CILOSTAZOL 100 MG PO TABS: 100.0000 mg | ORAL_TABLET | Freq: Two times a day (BID) | ORAL | Status: DC

## 2013-08-27 ENCOUNTER — Ambulatory Visit (INDEPENDENT_AMBULATORY_CARE_PROVIDER_SITE_OTHER): Payer: Medicare Other | Admitting: *Deleted

## 2013-08-27 ENCOUNTER — Encounter: Payer: Self-pay | Admitting: Internal Medicine

## 2013-08-27 DIAGNOSIS — I495 Sick sinus syndrome: Secondary | ICD-10-CM

## 2013-09-02 LAB — MDC_IDC_ENUM_SESS_TYPE_REMOTE
Brady Statistic AP VP Percent: 0.1 %
Brady Statistic AS VP Percent: 0.8 %
Brady Statistic AS VS Percent: 98.6 %
Lead Channel Impedance Value: 454 Ohm
Lead Channel Impedance Value: 484 Ohm
Lead Channel Pacing Threshold Amplitude: 0.875 V
Lead Channel Pacing Threshold Pulse Width: 0.4 ms
Lead Channel Pacing Threshold Pulse Width: 0.4 ms
Lead Channel Sensing Intrinsic Amplitude: 2.8 mV
Lead Channel Sensing Intrinsic Amplitude: 22.4 mV
Lead Channel Setting Pacing Amplitude: 2 V
Lead Channel Setting Pacing Pulse Width: 0.4 ms
Lead Channel Setting Sensing Sensitivity: 5.6 mV
MDC IDC MSMT LEADCHNL RA PACING THRESHOLD AMPLITUDE: 0.625 V
MDC IDC SET LEADCHNL RV PACING AMPLITUDE: 2.5 V
MDC IDC STAT BRADY AP VS PERCENT: 0.6 %

## 2013-09-17 ENCOUNTER — Encounter: Payer: Self-pay | Admitting: Cardiology

## 2013-12-02 ENCOUNTER — Ambulatory Visit (INDEPENDENT_AMBULATORY_CARE_PROVIDER_SITE_OTHER): Payer: Medicare Other | Admitting: Cardiology

## 2013-12-02 ENCOUNTER — Encounter: Payer: Self-pay | Admitting: Cardiology

## 2013-12-02 ENCOUNTER — Other Ambulatory Visit: Payer: Self-pay | Admitting: *Deleted

## 2013-12-02 VITALS — BP 133/71 | HR 88 | Ht 68.0 in | Wt 115.0 lb

## 2013-12-02 DIAGNOSIS — I359 Nonrheumatic aortic valve disorder, unspecified: Secondary | ICD-10-CM | POA: Diagnosis not present

## 2013-12-02 DIAGNOSIS — I050 Rheumatic mitral stenosis: Secondary | ICD-10-CM | POA: Diagnosis not present

## 2013-12-02 DIAGNOSIS — I4891 Unspecified atrial fibrillation: Secondary | ICD-10-CM | POA: Diagnosis not present

## 2013-12-02 DIAGNOSIS — R0989 Other specified symptoms and signs involving the circulatory and respiratory systems: Secondary | ICD-10-CM | POA: Diagnosis not present

## 2013-12-02 DIAGNOSIS — I350 Nonrheumatic aortic (valve) stenosis: Secondary | ICD-10-CM

## 2013-12-02 NOTE — Progress Notes (Signed)
Clinical Summary [name] is a 80 y.o.female seen today for follow up of the following medical problems.   1. Aortic stenosis  - s/p TAVR at [facility] in 2011, last echo 01/2013 showed no significant gradient or regurgitation  - denies any SOB, no chest pain, no presyncope or syncope. Fairly sedentary lifestyle, able to complete her normal level of activity without troubles.  - still follwed at [facility] annually - denies any SOB or DOE. Occasional LE edema, no orthopnea.    2. HTN  - not checking bp regularly  - compliant with medications   3. Permanent pacemaker placement  - placed after TAVR in 2011  - followed in clinic, normal pacemaker function 04/2013 by remote check  - denies any lightheadedness, dizziness, or fatigue   4. Afib  - denies any palpitations  - on coumadin, no bleeding problems.  - 07/2013 admit to [facility] with GI bleed. Was taken off coumadin at that time, has not restarted. Bleeding resolved. Reports endoscopy but unsure of results. Transfused 5 units of blood.    5. Mitral stenosis  - repeat echo 01/2013 with valve area by continuity 1.35, mean gradient 11 mmHg. Moderate to severe. PASP 43 mmHg. Moderately calcified valve.  - denies any symptoms  Past Medical History  Diagnosis Date  . Aortic stenosis     s/p TAVR with normal coronaries.  . DM2 (diabetes mellitus, type 2)   . Asthma   . HTN (hypertension)   . Gastroenteritis     Hx of it. last admission was 06/01/05  . Ejection fraction < 50%     echocardiogram in 08 showed preserved LV function w/EF of 55-60%.   . Diastolic heart failure     chronic  . Atypical chest pain   . COPD (chronic obstructive pulmonary disease) with emphysema   . PAD (peripheral artery disease)   . Cerebrovascular disease     noncritical   . Pacemaker     status post pacemaker after TAVR 6 2011 at [facility]  . Subclavian vein stenosis     both innominate vein and left subclavian vein stenosis secondary to PPM  implantation- incidental finding neck CT scan.  . Nodule of neck     neagtive CT scan neck for malingnancy.      Allergies  Allergen Reactions  . Ace Inhibitors     REACTION: swelling     Current Outpatient Prescriptions  Medication Sig Dispense Refill  . acetaminophen (TYLENOL) 325 MG tablet Take 650 mg by mouth every 6 (six) hours as needed.        Marland Kitchen albuterol (PROVENTIL) (2.5 MG/3ML) 0.083% nebulizer solution Take 2.5 mg by nebulization 3 (three) times daily. UAD      . albuterol (VENTOLIN HFA) 108 (90 BASE) MCG/ACT inhaler Inhale 2 puffs into the lungs every 6 (six) hours as needed. UAD.       Marland Kitchen amLODipine (NORVASC) 10 MG tablet Take 10 mg by mouth daily.        . Artificial Tear Ointment (EYE LUBRICANT OP) Place 2 drops into both eyes 3 (three) times daily as needed.       Marland Kitchen aspirin 81 MG chewable tablet Chew 81 mg by mouth daily.        . Calcium Carbonate-Vitamin D (CALTRATE 600+D) 600-400 MG-UNIT per tablet Take 1 tablet by mouth 2 (two) times daily.        . cilostazol (PLETAL) 100 MG tablet Take 1 tablet (100 mg total)  by mouth 2 (two) times daily.  60 tablet  6  . cloNIDine (CATAPRES - DOSED IN MG/24 HR) 0.1 mg/24hr patch Place 1 patch onto the skin once a week.        . Ferrous Sulfate (IRON) 325 (65 FE) MG TABS Take 1 tablet by mouth 2 (two) times daily.        . Fluticasone-Salmeterol (ADVAIR DISKUS) 250-50 MCG/DOSE AEPB Inhale 1 puff into the lungs 2 (two) times daily.        . metoprolol succinate (TOPROL-XL) 50 MG 24 hr tablet Take 1 tablet by mouth daily.      . Multiple Vitamin (MULTIVITAMIN) tablet Take 1 tablet by mouth daily.        . nitroGLYCERIN (NITROSTAT) 0.4 MG SL tablet Place 1 tablet (0.4 mg total) under the tongue every 5 (five) minutes as needed.  25 tablet  3  . pantoprazole (PROTONIX) 40 MG tablet Take 40 mg by mouth 2 (two) times daily.        Marland Kitchen senna (SENOKOT) 8.6 MG tablet Take 2 tablets by mouth daily as needed.       . tiotropium (SPIRIVA) 18 MCG  inhalation capsule Place 18 mcg into inhaler and inhale daily.        Marland Kitchen warfarin (COUMADIN) 2.5 MG tablet Take 1 tablet (2.5 mg total) by mouth every evening.  30 tablet  1   No current facility-administered medications for this visit.     No past surgical history on file.   Allergies  Allergen Reactions  . Ace Inhibitors     REACTION: swelling      Family History  Problem Relation Age of Onset  . Hypertension Neg Hx   . Diabetes Neg Hx   . Coronary artery disease Neg Hx      Social History [name] reports that she has never smoked. She has never used smokeless tobacco. [name] reports that she does not drink alcohol.   Review of Systems CONSTITUTIONAL: No weight loss, fever, chills, weakness or fatigue.  HEENT: Eyes: No visual loss, blurred vision, double vision or yellow sclerae.No hearing loss, sneezing, congestion, runny nose or sore throat.  SKIN: No rash or itching.  CARDIOVASCULAR: per HPI RESPIRATORY: No shortness of breath, cough or sputum.  GASTROINTESTINAL: No anorexia, nausea, vomiting or diarrhea. No abdominal pain or blood.  GENITOURINARY: No burning on urination, no polyuria NEUROLOGICAL: No headache, dizziness, syncope, paralysis, ataxia, numbness or tingling in the extremities. No change in bowel or bladder control.  MUSCULOSKELETAL: No muscle, back pain, joint pain or stiffness.  LYMPHATICS: No enlarged nodes. No history of splenectomy.  PSYCHIATRIC: No history of depression or anxiety.  ENDOCRINOLOGIC: No reports of sweating, cold or heat intolerance. No polyuria or polydipsia.  Marland Kitchen   Physical Examination p 88 bp 133/71 Wt 115 lbs BMI 17 Gen: resting comfortably, no acute distress HEENT: no scleral icterus, pupils equal round and reactive, no palptable cervical adenopathy,  CV: RRR, 2/6 diastolic murmur LLSB, no JVD Resp: Clear to auscultation bilaterally GI: abdomen is soft, non-tender, non-distended, normal bowel sounds, no  hepatosplenomegaly MSK: extremities are warm, no edema.  Skin: warm, no rash Neuro:  no focal deficits Psych: appropriate affect   Diagnostic Studies Echo 08/2010: LVEF 55-60%, grade I diastolic dysfunction, mild LAE, AV with mean grad 10 mmHg and no regurgitation, mild to mod MR, mild mitral stenosis with reported mean gradient of 10 mmHg   01/2013 Echo  LVEF 70%, moderate to severe MS (  mean gradient ), mild MR, normal functioning AVR. PASP 43.   01/2013 Carotid US  1-39% bilateral stenosis, patent antegrade vertebrals  09/2013 [facility] Echo HISTORY: TAVI and Special protocol  REASON: Assess Aortic Regurgitation Assess LV function  INDICATION: V70.7 - Examination of participant in clinical trial. 424.1 -  424.1 - Aortic valve disorders.  ECHOCARDIOGRAPHIC MEASUREMENTS -----------------------------------------------  2D DIMENSIONS  AORTA Values Normal Range MAIN PA Values Normal Range  Annulus: nm* cm [2.1 - 2.5] PA Main: nm* cm [1.5 - 2.1]  Aorta Sin: nm* cm [2.7 - 3.3] RIGHT VENTRICLE  ST Junction: nm* cm [2.3 - 2.9] RV Base: 3.5 cm [<= 4.2]  Asc.Aorta: nm* cm [2.3 - 3.1] RV Mid: 2.6 cm [<= 3.5]  LEFT VENTRICLE RV Length: 7.4 cm [<= 8.6]  LVIDd: 2.9 cm [3.9 - 5.3] INFERIOR VENA CAVA  LVIDs: 2.4 cm Max.IVC: 0.5 cm [<= 2.1]  FS: 17% [> 25%] Min.IVC: nm* cm [<= 1.7]  SWT: 1.4 cm [0.6 - 1.0] __________________  PWT: 1.4 cm [0.6 - 1.0] nm* - not measured  LEFT ATRIUM  LA Diam: 3.7 cm [2.7 - 3.8]  LA Area: 28 cm2 [< 20]  LA Volume: 81 mL [22 - 52]  ECHOCARDIOGRAPHIC DESCRIPTIONS -----------------------------------------------  AORTIC ROOT  Size: Not seen  Dissection: INDETERM FOR DISSECTION  AO Note: TAVI NOTED  AORTIC VALVE  Leaflets: OTHER PROSTHETIC Morphology: Normal  Mobility: Fully Mobile  AOV Note: TAVI NOTED  LEFT VENTRICLE Anterior: Normal  Size: SMALL Lateral: Normal  Contraction: Normal Septal: Normal  Closest EF: >55% (Estimated) Apical: Normal  LV masses:  No Masses Inferior: Normal  LVH: MILD LVH CONCENTRIC Posterior: Normal  Dias.FxClass: can't be determined  MITRAL VALVE  Leaflets: ABNORMAL Mobility: PARTIALLY MOBILE  Morphology: THICKENED LEAFLET(S)  MV Note: MAC  LEFT ATRIUM  Size: MILDLY ENLARGED  LA masses: No masses  Normal IAS  MAIN PA  Size: Normal  PULMONIC VALVE  Morphology: Normal  Mobility: Fully Mobile  RIGHT VENTRICLE  Size: Normal Free wall: Normal  Contraction: Normal RV masses: CATHETER IN RV  TAPSE: 1.1 cm, Normal Range [>= 1.6 cm]  TRICUSPID VALVE  Leaflets: Normal Mobility: Fully mobile  Morphology: Normal  RIGHT ATRIUM  Size: Normal RA Other: None  RA masses: CATHETER IN RA  PERICARDIUM  Fluid: No effusion  INFERIOR VENACAVA  Size: Normal Normal respiratory collapse  DOPPLER ECHO and OTHER SPECIAL PROCEDURES ------------------------------------  Aortic: No AR OTHER PROSTHETIC AoV  1.9 m/s peak vel 14 mmHg peak grad 8 mmHg mean grad 0.9 cm2 by DOPPLER  LVOT Diam: 1.7 cm. Resting LVOT Vel: 0.6 m/s. Dimensionless Index: 0.41  Mitral: MILD MR SEVERE MS  2.8 m/s peak vel 32 mmHg peak grad 13 mmHg mean grad  MV Inflow E Vel.= nm* cm/s MV Annulus E'Vel.= nm* cm/s E/E'Ratio= nm*  Tricuspid: MILD TR No TS  2.8 m/s peak TR vel 33 mmHg peak RV pressure  Pulmonary: TRIVIAL PR No PS  Other:  INTERPRETATION ---------------------------------------------------------------  NORMAL LEFT VENTRICULAR SYSTOLIC FUNCTION WITH MILD LVH  NORMAL RIGHT VENTRICULAR SYSTOLIC FUNCTION  VALVULAR REGURGITATION: MILD MR, TRIVIAL PR, MILD TR  VALVULAR STENOSIS: SEVERE MS  S/P TAVI (CORE VALVE PROTOCOL)  MODERATE MS: THERE IS SIGNIFICANT VARIATION IN MV GRADIENT DUE TO  ARRHYTHMIA; MV MEAN GRADIENT 6-13 mmHg; MVA = 1.3 CM^2 BY PRESSURE HALF  TIME METHOD.  Compared with prior Echo study on 10/17/2012: NO SIGNIFICANT CHANGE.    Assessment and Plan  1. Aortic stenosis  - s/p TAVR  in 2011  - denies any current symptoms. Normal  function by recent echo. [facility]. - continue to follow clinically   2. HTN  - at goal based on her age  - will stop clonidine due to concern for CNS effects and bradycardia in elderly  3. Pacemaker placement  - no current symptoms  - continue regular follow up in EP clinic, last check showed normal function   4. Carotid bruit  - no neurlogical symptoms  - no significant stenosis by recent US   5. Mitral stenosis  - moderate to severe on most recent echo  - she denies any significant symptoms  - continue to follow clinically, given her age, lack of symptoms, and borderline severity will not puruse any form of intervention at this time.   6. Afib  - denies any symptoms  - continue rate control. Recent GI bleed now off coumadin, will continue ASA. Request records.     F/u 6 months   Antoine PocheJonathan F. Jonty Morrical, M.D., F.A.C.C.

## 2013-12-02 NOTE — Patient Instructions (Addendum)
   Stop using Clonidine Patch. Continue all other medications.   Your physician wants you to follow up in: 6 months.  You will receive a reminder letter in the mail one-two months in advance.  If you don't receive a letter, please call our office to schedule the follow up appointment

## 2014-01-21 ENCOUNTER — Other Ambulatory Visit: Payer: Self-pay | Admitting: Cardiology

## 2014-01-21 NOTE — Telephone Encounter (Signed)
Medication sent to pharmacy  

## 2014-01-22 ENCOUNTER — Ambulatory Visit (INDEPENDENT_AMBULATORY_CARE_PROVIDER_SITE_OTHER): Payer: Medicare Other | Admitting: Internal Medicine

## 2014-01-22 ENCOUNTER — Encounter: Payer: Self-pay | Admitting: Internal Medicine

## 2014-01-22 VITALS — BP 138/76 | HR 92 | Ht 60.0 in | Wt 115.0 lb

## 2014-01-22 DIAGNOSIS — I480 Paroxysmal atrial fibrillation: Secondary | ICD-10-CM

## 2014-01-22 DIAGNOSIS — I4891 Unspecified atrial fibrillation: Secondary | ICD-10-CM

## 2014-01-22 DIAGNOSIS — I10 Essential (primary) hypertension: Secondary | ICD-10-CM

## 2014-01-22 DIAGNOSIS — I442 Atrioventricular block, complete: Secondary | ICD-10-CM

## 2014-01-22 LAB — MDC_IDC_ENUM_SESS_TYPE_INCLINIC
Battery Remaining Longevity: 95 mo
Battery Voltage: 2.8 V
Brady Statistic AP VS Percent: 1 %
Brady Statistic AS VP Percent: 2 %
Brady Statistic AS VS Percent: 97 %
Date Time Interrogation Session: 20150925111951
Lead Channel Impedance Value: 479 Ohm
Lead Channel Pacing Threshold Amplitude: 0.5 V
Lead Channel Pacing Threshold Amplitude: 1 V
Lead Channel Pacing Threshold Pulse Width: 0.4 ms
Lead Channel Pacing Threshold Pulse Width: 0.4 ms
Lead Channel Sensing Intrinsic Amplitude: 22.4 mV
Lead Channel Setting Sensing Sensitivity: 5.6 mV
MDC IDC MSMT BATTERY IMPEDANCE: 462 Ohm
MDC IDC MSMT LEADCHNL RA IMPEDANCE VALUE: 538 Ohm
MDC IDC MSMT LEADCHNL RA SENSING INTR AMPL: 2 mV
MDC IDC SET LEADCHNL RA PACING AMPLITUDE: 2 V
MDC IDC SET LEADCHNL RV PACING AMPLITUDE: 2.5 V
MDC IDC SET LEADCHNL RV PACING PULSEWIDTH: 0.4 ms
MDC IDC STAT BRADY AP VP PERCENT: 0 %

## 2014-01-22 NOTE — Progress Notes (Signed)
PCP: Ignatius Specking., MD Primary Cardiologist: Dr Marybelle Killings is a 80 y.o. female who presents today for routine electrophysiology followup.  Since last being seen in our clinic, the patient reports doing reasonably well.  She had GI bleeding 4/15 for which she was taken off of coumadin.  She was placed on ASA.  Today, she denies symptoms of palpitations, chest pain, shortness of breath,  lower extremity edema, dizziness, presyncope, or syncope.  The patient is otherwise without complaint today.   Past Medical History  Diagnosis Date  . Aortic stenosis     s/p TAVR with normal coronaries.  . DM2 (diabetes mellitus, type 2)   . Asthma   . HTN (hypertension)   . Gastroenteritis     Hx of it. last admission was 06/01/05  . Ejection fraction < 50%     echocardiogram in 08 showed preserved LV function w/EF of 55-60%.   . Diastolic heart failure     chronic  . Atypical chest pain   . COPD (chronic obstructive pulmonary disease) with emphysema   . PAD (peripheral artery disease)   . Cerebrovascular disease     noncritical   . Pacemaker     status post pacemaker after TAVR 6 2011 at [facility]  . Subclavian vein stenosis     both innominate vein and left subclavian vein stenosis secondary to PPM implantation- incidental finding neck CT scan.  . Nodule of neck     neagtive CT scan neck for malingnancy.    No past surgical history on file.  Current Outpatient Prescriptions  Medication Sig Dispense Refill  . albuterol (PROVENTIL) (2.5 MG/3ML) 0.083% nebulizer solution Take 2.5 mg by nebulization 3 (three) times daily. UAD      . albuterol (VENTOLIN HFA) 108 (90 BASE) MCG/ACT inhaler Inhale 2 puffs into the lungs every 6 (six) hours as needed. UAD.       Marland Kitchen amLODipine (NORVASC) 10 MG tablet Take 10 mg by mouth daily.        Marland Kitchen aspirin 81 MG chewable tablet Chew 81 mg by mouth daily.        . Calcium Carbonate-Vitamin D (CALTRATE 600+D) 600-400 MG-UNIT per tablet Take 1 tablet by  mouth 2 (two) times daily.        . cilostazol (PLETAL) 100 MG tablet TAKE 1 TABLET BY MOUTH TWICE DAILY  60 tablet  6  . Ferrous Sulfate (IRON) 325 (65 FE) MG TABS Take 1 tablet by mouth 2 (two) times daily.        . Fluticasone-Salmeterol (ADVAIR DISKUS) 250-50 MCG/DOSE AEPB Inhale 1 puff into the lungs 2 (two) times daily.        . metoprolol succinate (TOPROL-XL) 50 MG 24 hr tablet Take 1 tablet by mouth daily.      . Multiple Vitamin (MULTIVITAMIN) tablet Take 1 tablet by mouth daily.        . nitroGLYCERIN (NITROSTAT) 0.4 MG SL tablet Place 1 tablet (0.4 mg total) under the tongue every 5 (five) minutes as needed.  25 tablet  3  . pantoprazole (PROTONIX) 40 MG tablet Take 40 mg by mouth 2 (two) times daily.        Marland Kitchen tiotropium (SPIRIVA) 18 MCG inhalation capsule Place 18 mcg into inhaler and inhale daily.         No current facility-administered medications for this visit.    Physical Exam: Filed Vitals:   01/22/14 1102  BP: 138/76  Pulse: 92  Height:  5' (1.524 m)  Weight: 115 lb (52.164 kg)  SpO2: 97%    GEN- The patient is well appearing, alert and oriented x 3 today.   Head- normocephalic, atraumatic Eyes-  Sclera clear, conjunctiva pink Ears- hearing intact Oropharynx- clear Lungs- Clear to ausculation bilaterally, normal work of breathing Chest- pacemaker pocket is well healed Heart- Regular rate and rhythm, 1/6 diastolic murmur at the apex GI- soft, NT, ND, + BS Extremities- no clubbing, cyanosis, or edema  Pacemaker interrogation- reviewed in detail today,  See PACEART report  Assessment and Plan:  1. Mobitz II AV block Normal pacemaker function See Pace Art report No changes today  2.  afib- low afib burden (2%) Her chads2vasc score is at least 8.  In addition, she has mitral stenosis which substantially increases her stroke risk.  I would therefore highly suggest anticoagulation with warfarin if at all possible.  The patient and her daughter are not certain  if reinitiation of anticoagulation has been considered since it was stopped.  She is presently on Aspirin which is not an adequate substitute for anticoagulation.  Given her valvular afib, I would be reluctant to use noval anticoagulants at this time. I will defer to Dr Sherril Croon and Wyline Mood about possible timing of anticoagulation  3.  HTN Stable No change required today  Enroll in Carelink Return in 1 year

## 2014-01-22 NOTE — Patient Instructions (Signed)
Your physician recommends that you schedule a follow-up appointment in: 1 year with Dr. Allred. You will receive a reminder letter in the mail in about 10 months reminding you to call and schedule your appointment. If you don't receive this letter, please contact our office. Carelink/device check on 04/26/14. Your physician recommends that you continue on your current medications as directed. Please refer to the Current Medication list given to you today. 

## 2014-02-05 ENCOUNTER — Ambulatory Visit: Payer: Medicare Other | Admitting: Cardiology

## 2014-03-16 ENCOUNTER — Encounter: Payer: Self-pay | Admitting: Cardiology

## 2014-03-16 ENCOUNTER — Ambulatory Visit (INDEPENDENT_AMBULATORY_CARE_PROVIDER_SITE_OTHER): Payer: Medicare Other | Admitting: Cardiology

## 2014-03-16 VITALS — BP 132/58 | HR 79 | Ht 62.0 in | Wt 115.0 lb

## 2014-03-16 DIAGNOSIS — I480 Paroxysmal atrial fibrillation: Secondary | ICD-10-CM

## 2014-03-16 DIAGNOSIS — I10 Essential (primary) hypertension: Secondary | ICD-10-CM

## 2014-03-16 DIAGNOSIS — Z950 Presence of cardiac pacemaker: Secondary | ICD-10-CM

## 2014-03-16 DIAGNOSIS — I050 Rheumatic mitral stenosis: Secondary | ICD-10-CM

## 2014-03-16 DIAGNOSIS — I350 Nonrheumatic aortic (valve) stenosis: Secondary | ICD-10-CM

## 2014-03-16 DIAGNOSIS — R0989 Other specified symptoms and signs involving the circulatory and respiratory systems: Secondary | ICD-10-CM

## 2014-03-16 MED ORDER — AMLODIPINE BESYLATE 5 MG PO TABS: 5.0000 mg | ORAL_TABLET | Freq: Every day | ORAL | Status: DC

## 2014-03-16 NOTE — Patient Instructions (Signed)
   Decrease Norvasc to 5mg  daily (may break your 10mg  tablet in half till finish current supply) - new sent to pharm Continue all other medications.   Your physician wants you to follow up in: 6 months.  You will receive a reminder letter in the mail one-two months in advance.  If you don't receive a letter, please call our office to schedule the follow up appointment

## 2014-03-16 NOTE — Progress Notes (Signed)
Clinical Summary [name] is a 80 y.o.female seen today for follow up of the following medical problems.   1. Aortic stenosis  - s/p TAVR at [facility] in 2011, last echo 01/2013 showed no significant gradient or regurgitation. She is followed at [facility] annually.  - denies any SOB, no chest pain, no presyncope or syncope. Fairly sedentary lifestyle, able to complete her normal level of activity without troubles. .   2. HTN  - not checking bp regularly  - compliant with medications   3. Permanent pacemaker placement  - placed after TAVR in 2011  - followed in clinic, normal pacemaker function 12/2013 by remote check  - denies any lightheadedness, dizziness, or fatigue   4. Afib  - denies any palpitations  - 07/2013 admit to [facility] with GI bleed. Was taken off coumadin at that time, has not restarted. Bleeding resolved. Reports endoscopy but unsure of results. Transfused 5 units of blood.    5. Mitral stenosis  - repeat echo 01/2013 with valve area by continuity 1.35, mean gradient 11 mmHg. Moderate to severe. PASP 43 mmHg. Moderately calcified valve.  - denies any symptoms    Past Medical History  Diagnosis Date  . Aortic stenosis     s/p TAVR with normal coronaries.  . DM2 (diabetes mellitus, type 2)   . Asthma   . HTN (hypertension)   . Gastroenteritis     Hx of it. last admission was 06/01/05  . Ejection fraction < 50%     echocardiogram in 08 showed preserved LV function w/EF of 55-60%.   . Diastolic heart failure     chronic  . Atypical chest pain   . COPD (chronic obstructive pulmonary disease) with emphysema   . PAD (peripheral artery disease)   . Cerebrovascular disease     noncritical   . Pacemaker     status post pacemaker after TAVR 6 2011 at [facility]  . Subclavian vein stenosis     both innominate vein and left subclavian vein stenosis secondary to PPM implantation- incidental finding neck CT scan.  . Nodule of neck     neagtive CT scan  neck for malingnancy.      Allergies  Allergen Reactions  . Ace Inhibitors     REACTION: swelling     Current Outpatient Prescriptions  Medication Sig Dispense Refill  . albuterol (PROVENTIL) (2.5 MG/3ML) 0.083% nebulizer solution Take 2.5 mg by nebulization 3 (three) times daily. UAD    . albuterol (VENTOLIN HFA) 108 (90 BASE) MCG/ACT inhaler Inhale 2 puffs into the lungs every 6 (six) hours as needed. UAD.     Marland Kitchen amLODipine (NORVASC) 10 MG tablet Take 10 mg by mouth daily.      Marland Kitchen aspirin 81 MG chewable tablet Chew 81 mg by mouth daily.      . Calcium Carbonate-Vitamin D (CALTRATE 600+D) 600-400 MG-UNIT per tablet Take 1 tablet by mouth 2 (two) times daily.      . cilostazol (PLETAL) 100 MG tablet TAKE 1 TABLET BY MOUTH TWICE DAILY 60 tablet 6  . Ferrous Sulfate (IRON) 325 (65 FE) MG TABS Take 1 tablet by mouth 2 (two) times daily.      . Fluticasone-Salmeterol (ADVAIR DISKUS) 250-50 MCG/DOSE AEPB Inhale 1 puff into the lungs 2 (two) times daily.      . metoprolol succinate (TOPROL-XL) 50 MG 24 hr tablet Take 1 tablet by mouth daily.    . Multiple Vitamin (MULTIVITAMIN) tablet Take 1 tablet  by mouth daily.      . nitroGLYCERIN (NITROSTAT) 0.4 MG SL tablet Place 1 tablet (0.4 mg total) under the tongue every 5 (five) minutes as needed. 25 tablet 3  . pantoprazole (PROTONIX) 40 MG tablet Take 40 mg by mouth 2 (two) times daily.      Marland Kitchen. tiotropium (SPIRIVA) 18 MCG inhalation capsule Place 18 mcg into inhaler and inhale daily.       No current facility-administered medications for this visit.     No past surgical history on file.   Allergies  Allergen Reactions  . Ace Inhibitors     REACTION: swelling      Family History  Problem Relation Age of Onset  . Hypertension Neg Hx   . Diabetes Neg Hx   . Coronary artery disease Neg Hx      Social History [name] reports that she has never smoked. She has never used smokeless tobacco. [name] reports that she does not drink  alcohol.   Review of Systems CONSTITUTIONAL: No weight loss, fever, chills, weakness or fatigue.  HEENT: Eyes: No visual loss, blurred vision, double vision or yellow sclerae.No hearing loss, sneezing, congestion, runny nose or sore throat.  SKIN: No rash or itching.  CARDIOVASCULAR: per HPI RESPIRATORY: No shortness of breath, cough or sputum.  GASTROINTESTINAL: No anorexia, nausea, vomiting or diarrhea. No abdominal pain or blood.  GENITOURINARY: No burning on urination, no polyuria NEUROLOGICAL: No headache, dizziness, syncope, paralysis, ataxia, numbness or tingling in the extremities. No change in bowel or bladder control.  MUSCULOSKELETAL: No muscle, back pain, joint pain or stiffness.  LYMPHATICS: No enlarged nodes. No history of splenectomy.  PSYCHIATRIC: No history of depression or anxiety.  ENDOCRINOLOGIC: No reports of sweating, cold or heat intolerance. No polyuria or polydipsia.  Marland Kitchen.   Physical Examination p 79 bp 132/58 Wt 115 lbs BMI 21 Gen: resting comfortably, no acute distress HEENT: no scleral icterus, pupils equal round and reactive, no palptable cervical adenopathy,  CV: RRR, no m/r/g, no JVD Resp: Clear to auscultation bilaterally GI: abdomen is soft, non-tender, non-distended, normal bowel sounds, no hepatosplenomegaly MSK: extremities are warm, no edema.  Skin: warm, no rash Neuro:  no focal deficits Psych: appropriate affect   Diagnostic Studies Echo 08/2010: LVEF 55-60%, grade I diastolic dysfunction, mild LAE, AV with mean grad 10 mmHg and no regurgitation, mild to mod MR, mild mitral stenosis with reported mean gradient of 10 mmHg   01/2013 Echo  LVEF 70%, moderate to severe MS (mean gradient 11mmHg), mild MR, normal functioning AVR. PASP 43.   01/2013 Carotid US  1-39% bilateral stenosis, patent antegrade vertebrals  09/2013 [facility] Echo HISTORY: TAVI and Special protocol  REASON: Assess Aortic Regurgitation Assess LV function  INDICATION: V70.7  - Examination of participant in clinical trial. 424.1 -  424.1 - Aortic valve disorders.  ECHOCARDIOGRAPHIC MEASUREMENTS -----------------------------------------------  2D DIMENSIONS  AORTA Values Normal Range MAIN PA Values Normal Range  Annulus: nm* cm [2.1 - 2.5] PA Main: nm* cm [1.5 - 2.1]  Aorta Sin: nm* cm [2.7 - 3.3] RIGHT VENTRICLE  ST Junction: nm* cm [2.3 - 2.9] RV Base: 3.5 cm [<= 4.2]  Asc.Aorta: nm* cm [2.3 - 3.1] RV Mid: 2.6 cm [<= 3.5]  LEFT VENTRICLE RV Length: 7.4 cm [<= 8.6]  LVIDd: 2.9 cm [3.9 - 5.3] INFERIOR VENA CAVA  LVIDs: 2.4 cm Max.IVC: 0.5 cm [<= 2.1]  FS: 17% [> 25%] Min.IVC: nm* cm [<= 1.7]  SWT: 1.4 cm [0.6 -  1.0] __________________  PWT: 1.4 cm [0.6 - 1.0] nm* - not measured  LEFT ATRIUM  LA Diam: 3.7 cm [2.7 - 3.8]  LA Area: 28 cm2 [< 20]  LA Volume: 81 mL [22 - 52]  ECHOCARDIOGRAPHIC DESCRIPTIONS -----------------------------------------------  AORTIC ROOT  Size: Not seen  Dissection: INDETERM FOR DISSECTION  AO Note: TAVI NOTED  AORTIC VALVE  Leaflets: OTHER PROSTHETIC Morphology: Normal  Mobility: Fully Mobile  AOV Note: TAVI NOTED  LEFT VENTRICLE Anterior: Normal  Size: SMALL Lateral: Normal  Contraction: Normal Septal: Normal  Closest EF: >55% (Estimated) Apical: Normal  LV masses: No Masses Inferior: Normal  LVH: MILD LVH CONCENTRIC Posterior: Normal  Dias.FxClass: can't be determined  MITRAL VALVE  Leaflets: ABNORMAL Mobility: PARTIALLY MOBILE  Morphology: THICKENED LEAFLET(S)  MV Note: MAC  LEFT ATRIUM  Size: MILDLY ENLARGED  LA masses: No masses  Normal IAS  MAIN PA  Size: Normal  PULMONIC VALVE  Morphology: Normal  Mobility: Fully Mobile  RIGHT VENTRICLE  Size: Normal Free wall: Normal  Contraction: Normal RV masses: CATHETER IN RV  TAPSE: 1.1 cm, Normal Range [>= 1.6 cm]  TRICUSPID VALVE  Leaflets: Normal Mobility: Fully mobile  Morphology: Normal  RIGHT ATRIUM   Size: Normal RA Other: None  RA masses: CATHETER IN RA  PERICARDIUM  Fluid: No effusion  INFERIOR VENACAVA  Size: Normal Normal respiratory collapse  DOPPLER ECHO and OTHER SPECIAL PROCEDURES ------------------------------------  Aortic: No AR OTHER PROSTHETIC AoV  1.9 m/s peak vel 14 mmHg peak grad 8 mmHg mean grad 0.9 cm2 by DOPPLER  LVOT Diam: 1.7 cm. Resting LVOT Vel: 0.6 m/s. Dimensionless Index: 0.41  Mitral: MILD MR SEVERE MS  2.8 m/s peak vel 32 mmHg peak grad 13 mmHg mean grad  MV Inflow E Vel.= nm* cm/s MV Annulus E'Vel.= nm* cm/s E/E'Ratio= nm*  Tricuspid: MILD TR No TS  2.8 m/s peak TR vel 33 mmHg peak RV pressure  Pulmonary: TRIVIAL PR No PS  Other:  INTERPRETATION ---------------------------------------------------------------  NORMAL LEFT VENTRICULAR SYSTOLIC FUNCTION WITH MILD LVH  NORMAL RIGHT VENTRICULAR SYSTOLIC FUNCTION  VALVULAR REGURGITATION: MILD MR, TRIVIAL PR, MILD TR  VALVULAR STENOSIS: SEVERE MS  S/P TAVI (CORE VALVE PROTOCOL)  MODERATE MS: THERE IS SIGNIFICANT VARIATION IN MV GRADIENT DUE TO  ARRHYTHMIA; MV MEAN GRADIENT 6-13 mmHg; MVA = 1.3 CM^2 BY PRESSURE HALF  TIME METHOD.  Compared with prior Echo study on 10/17/2012: NO SIGNIFICANT CHANGE.      Assessment and Plan  1. Aortic stenosis  - s/p TAVR in 2011  - denies any current symptoms. Normal function by recent echo. Also recent f/u at [facility] 09/2013 with good report. - continue to follow clinically   2. HTN  - describes some orthostatic symptoms, will decrease norvasc to 5mg  daily.   3. Pacemaker placement  - no current symptoms  - continue regular follow up in EP clinic, last check showed normal function   4. Carotid bruit  - no neurlogical symptoms  - no significant stenosis by recent US   5. Mitral stenosis  - moderate to severe on most recent echo  - she denies any significant symptoms  - continue to follow clinically, given her age, lack of  symptoms, and borderline severity will not puruse any form of intervention at this time.   6. Afib  - denies any symptoms  - continue rate control. Recent GI bleed now off coumadin, will continue ASA. Will request records, unclear at this time risk vs benefit of considering  restarting anticoag. Review hospital notes including EGD and possible colonoscopy reports.    F/u 6 months   Antoine PocheJonathan F. Saprina Chuong, M.D.

## 2014-04-26 ENCOUNTER — Encounter: Payer: Medicare Other | Admitting: *Deleted

## 2014-04-26 ENCOUNTER — Telehealth: Payer: Self-pay | Admitting: Internal Medicine

## 2014-04-26 NOTE — Telephone Encounter (Signed)
Transmission green light is not doing like it should patient stated.

## 2014-04-26 NOTE — Telephone Encounter (Signed)
Spoke w/ pt and informed her to call tech services w/ [employer] to receive help trouble shooting wirex.

## 2014-04-28 ENCOUNTER — Encounter: Payer: Self-pay | Admitting: Cardiology

## 2014-04-29 ENCOUNTER — Telehealth: Payer: Self-pay | Admitting: *Deleted

## 2014-04-29 ENCOUNTER — Ambulatory Visit (INDEPENDENT_AMBULATORY_CARE_PROVIDER_SITE_OTHER): Payer: Medicare Other | Admitting: *Deleted

## 2014-04-29 DIAGNOSIS — I495 Sick sinus syndrome: Secondary | ICD-10-CM

## 2014-04-29 LAB — MDC_IDC_ENUM_SESS_TYPE_REMOTE
Battery Remaining Longevity: 7.5
Brady Statistic AP VP Percent: 0.1 % — CL
Brady Statistic AS VP Percent: 0.4 %
Lead Channel Impedance Value: 469 Ohm
Lead Channel Impedance Value: 546 Ohm
Lead Channel Pacing Threshold Pulse Width: 0.4 ms
Lead Channel Sensing Intrinsic Amplitude: 0.7 mV
Lead Channel Setting Pacing Amplitude: 2 V
Lead Channel Setting Pacing Amplitude: 2.5 V
Lead Channel Setting Sensing Sensitivity: 5.6 mV
MDC IDC MSMT BATTERY IMPEDANCE: 512 Ohm
MDC IDC MSMT BATTERY VOLTAGE: 2.8 V
MDC IDC MSMT LEADCHNL RA PACING THRESHOLD AMPLITUDE: 0.625 V
MDC IDC MSMT LEADCHNL RV PACING THRESHOLD AMPLITUDE: 0.875 V
MDC IDC MSMT LEADCHNL RV PACING THRESHOLD PULSEWIDTH: 0.4 ms
MDC IDC MSMT LEADCHNL RV SENSING INTR AMPL: 16 mV
MDC IDC SET LEADCHNL RV PACING PULSEWIDTH: 0.4 ms
MDC IDC STAT BRADY AP VS PERCENT: 0.8 %
MDC IDC STAT BRADY AS VS PERCENT: 98.7 %

## 2014-04-29 NOTE — Telephone Encounter (Signed)
Gave pt instructions to correctly connect her Carelink & WireX. I waited on phone w/ her step by step through test transmission until "target" light illuminated green. Remote was received.

## 2014-05-03 NOTE — Progress Notes (Signed)
Remote pacemaker transmission.   

## 2014-05-14 ENCOUNTER — Encounter: Payer: Self-pay | Admitting: Cardiology

## 2014-05-20 ENCOUNTER — Encounter: Payer: Self-pay | Admitting: Internal Medicine

## 2014-08-02 ENCOUNTER — Telehealth: Payer: Self-pay | Admitting: Cardiology

## 2014-08-02 ENCOUNTER — Ambulatory Visit (INDEPENDENT_AMBULATORY_CARE_PROVIDER_SITE_OTHER): Payer: Medicare Other | Admitting: *Deleted

## 2014-08-02 DIAGNOSIS — I495 Sick sinus syndrome: Secondary | ICD-10-CM | POA: Diagnosis not present

## 2014-08-02 NOTE — Progress Notes (Signed)
Remote pacemaker transmission.   

## 2014-08-02 NOTE — Telephone Encounter (Signed)
Spoke with pt and reminded pt of remote transmission that is due today. Pt verbalized understanding.   

## 2014-08-03 ENCOUNTER — Other Ambulatory Visit: Payer: Self-pay | Admitting: Internal Medicine

## 2014-08-03 ENCOUNTER — Encounter: Payer: Self-pay | Admitting: Internal Medicine

## 2014-08-03 LAB — MDC_IDC_ENUM_SESS_TYPE_INCLINIC
Lead Channel Impedance Value: 478 Ohm
Lead Channel Impedance Value: 546 Ohm
Lead Channel Pacing Threshold Amplitude: 0.75 V
Lead Channel Sensing Intrinsic Amplitude: 1 mV
Lead Channel Sensing Intrinsic Amplitude: 16 mV
Lead Channel Setting Pacing Amplitude: 2 V
Lead Channel Setting Pacing Pulse Width: 0.4 ms
Lead Channel Setting Sensing Sensitivity: 5.6 mV
MDC IDC MSMT BATTERY VOLTAGE: 2.8 V
MDC IDC MSMT LEADCHNL RA PACING THRESHOLD AMPLITUDE: 0.625 V
MDC IDC MSMT LEADCHNL RA PACING THRESHOLD PULSEWIDTH: 0.4 ms
MDC IDC MSMT LEADCHNL RV PACING THRESHOLD PULSEWIDTH: 0.4 ms
MDC IDC SET LEADCHNL RV PACING AMPLITUDE: 2.5 V
MDC IDC STAT BRADY AP VP PERCENT: 0.1 %
MDC IDC STAT BRADY AP VS PERCENT: 0.8 %
MDC IDC STAT BRADY AS VP PERCENT: 0.4 %
MDC IDC STAT BRADY AS VS PERCENT: 98.6 %

## 2014-08-16 ENCOUNTER — Encounter: Payer: Self-pay | Admitting: Cardiology

## 2014-09-22 ENCOUNTER — Other Ambulatory Visit: Payer: Self-pay | Admitting: Cardiology

## 2014-09-23 ENCOUNTER — Encounter: Payer: Self-pay | Admitting: *Deleted

## 2014-09-23 ENCOUNTER — Ambulatory Visit (INDEPENDENT_AMBULATORY_CARE_PROVIDER_SITE_OTHER): Payer: Medicare Other | Admitting: Cardiology

## 2014-09-23 VITALS — BP 165/75 | HR 73 | Ht 62.0 in | Wt 114.4 lb

## 2014-09-23 DIAGNOSIS — I10 Essential (primary) hypertension: Secondary | ICD-10-CM | POA: Diagnosis not present

## 2014-09-23 DIAGNOSIS — I350 Nonrheumatic aortic (valve) stenosis: Secondary | ICD-10-CM

## 2014-09-23 DIAGNOSIS — I050 Rheumatic mitral stenosis: Secondary | ICD-10-CM

## 2014-09-23 DIAGNOSIS — I480 Paroxysmal atrial fibrillation: Secondary | ICD-10-CM | POA: Diagnosis not present

## 2014-09-23 DIAGNOSIS — Z950 Presence of cardiac pacemaker: Secondary | ICD-10-CM

## 2014-09-23 NOTE — Progress Notes (Signed)
Clinical Summary [name] is a 81 y.o.female seen today for follow up of the following medical problems.   1. Aortic stenosis  - [facility], [facility] continued [facility]. - denies any SOB, no chest pain, no presyncope or syncope.   2. HTN  - not checking bp regularly  - compliant with medications.  - norvasc was decreased previously due to some orthostatic symptoms, since change dizziness has resolved.   3. Permanent pacemaker placement  - placed after TAVR in 2011  - normal pacemaker function 07/2014 by remote check  - denies any lightheadedness, dizziness, or fatigue   4. Afib  - denies any palpitations  - 07/2013 admit to [facility] with GI bleed. Was taken off coumadin at that time. Found to have a diverticular bleed, INR was 4.5 at that time and appears she was on ASA as well at that time. Received 5 units of pRBCs - denies any palpitations  5. Mitral stenosis   - denies any symptoms  6. PAD - can walk significant distances at store without symptoms. No sores on feet.   Past Medical History  Diagnosis Date  . Aortic stenosis     s/p TAVR with normal coronaries.  . DM2 (diabetes mellitus, type 2)   . Asthma   . HTN (hypertension)   . Gastroenteritis     Hx of it. last admission was 06/01/05  . Ejection fraction < 50%     echocardiogram in 08 showed preserved LV function w/EF of 55-60%.   . Diastolic heart failure     chronic  . Atypical chest pain   . COPD (chronic obstructive pulmonary disease) with emphysema   . PAD (peripheral artery disease)   . Cerebrovascular disease     noncritical   . Pacemaker     status post pacemaker after [facility] 6 [facility]  . Subclavian vein stenosis     both innominate vein and left subclavian vein stenosis secondary to PPM implantation- incidental finding neck CT scan.  . Nodule of neck     neagtive CT scan neck for malingnancy.       Allergies  Allergen Reactions  . Ace Inhibitors     REACTION: swelling     Current Outpatient Prescriptions  Medication Sig Dispense Refill  . albuterol (PROVENTIL) (2.5 MG/3ML) 0.083% nebulizer solution Take 2.5 mg by nebulization 3 (three) times daily. UAD    . albuterol (VENTOLIN HFA) 108 (90 BASE) MCG/ACT inhaler Inhale 2 puffs into the lungs every 6 (six) hours as needed. UAD.     Marland Kitchen. amLODipine (NORVASC) 5 MG tablet Take 1 tablet (5 mg total) by mouth daily. 30 tablet 6  . aspirin 81 MG chewable tablet Chew 81 mg by mouth daily.      . Calcium Carbonate-Vitamin D (CALTRATE 600+D) 600-400 MG-UNIT per tablet Take 1 tablet by mouth 2 (two) times daily.      . cilostazol (PLETAL) 100 MG tablet TAKE 1 TABLET BY MOUTH TWICE DAILY 60 tablet 6  . Ferrous Sulfate (IRON) 325 (65 FE) MG TABS Take 1 tablet by mouth 2 (two) times daily.      . Fluticasone-Salmeterol (ADVAIR DISKUS) 250-50 MCG/DOSE AEPB Inhale 1 puff into the lungs 2 (two) times daily.      . metoprolol succinate (TOPROL-XL) 50 MG 24 hr tablet Take 1 tablet by mouth daily.    . Multiple Vitamin (MULTIVITAMIN) tablet  Take 1 tablet by mouth daily.      . nitroGLYCERIN (NITROSTAT) 0.4 MG SL tablet Place 1 tablet (0.4 mg total) under the tongue every 5 (five) minutes as needed. 25 tablet 3  . pantoprazole (PROTONIX) 40 MG tablet Take 40 mg by mouth 2 (two) times daily.      Marland Kitchen tiotropium (SPIRIVA) 18 MCG inhalation capsule Place 18 mcg into inhaler and inhale daily.       No current facility-administered medications for this visit.     No past surgical history on file.   Allergies  Allergen Reactions  . Ace Inhibitors     REACTION: swelling      Family History  Problem Relation Age of Onset  . Hypertension Neg Hx   . Diabetes Neg Hx   . Coronary artery disease Neg Hx      Social History Ms. Schupp reports that she has never smoked. She has never used smokeless tobacco. Ms. Hendricksen reports that she does not drink  alcohol.   Review of Systems CONSTITUTIONAL: No weight loss, fever, chills, weakness or fatigue.  HEENT: Eyes: No visual loss, blurred vision, double vision or yellow sclerae.No hearing loss, sneezing, congestion, runny nose or sore throat.  SKIN: No rash or itching.  CARDIOVASCULAR: per HPI RESPIRATORY: No shortness of breath, cough or sputum.  GASTROINTESTINAL: No anorexia, nausea, vomiting or diarrhea. No abdominal pain or blood.  GENITOURINARY: No burning on urination, no polyuria NEUROLOGICAL: No headache, dizziness, syncope, paralysis, ataxia, numbness or tingling in the extremities. No change in bowel or bladder control.  MUSCULOSKELETAL: No muscle, back pain, joint pain or stiffness.  LYMPHATICS: No enlarged nodes. No history of splenectomy.  PSYCHIATRIC: No history of depression or anxiety.  ENDOCRINOLOGIC: No reports of sweating, cold or heat intolerance. No polyuria or polydipsia.  Marland Kitchen   Physical Examination Filed Vitals:   09/23/14 1300  BP: 165/75  Pulse: 73   Filed Vitals:   09/23/14 1300  Height:  (1.575 m)  Weight: 114 lb 6.4 oz (51.891 kg)    Gen: resting comfortably, no acute distress HEENT: no scleral icterus, pupils equal round and reactive, no palptable cervical adenopathy,  CV: irreg, 2/6 systolic RUSB, no JVD Resp: Clear to auscultation bilaterally GI: abdomen is soft, non-tender, non-distended, normal bowel sounds, no hepatosplenomegaly MSK: extremities are warm, no edema.  Skin: warm, no rash Neuro:  no focal deficits Psych: appropriate affect   Diagnostic Studies  Echo 08/2010: LVEF 55-60%, grade I diastolic dysfunction, mild LAE, AV with mean grad 10 mmHg and no regurgitation, mild to mod MR, mild mitral stenosis with reported mean gradient of 10 mmHg   01/2013 Echo  LVEF 70%, moderate to severe MS (mean gradient ), mild MR, normal functioning AVR. PASP 43.   01/2013 Carotid US  1-39% bilateral stenosis, patent antegrade  vertebrals  09/2013 [facility] Echo HISTORY: TAVI and Special protocol  REASON: Assess Aortic Regurgitation Assess LV function  INDICATION: V70.7 - Examination of participant in clinical trial. 424.1 -  424.1 - Aortic valve disorders.  ECHOCARDIOGRAPHIC MEASUREMENTS -----------------------------------------------  2D DIMENSIONS  AORTA Values Normal Range MAIN PA Values Normal Range  Annulus: nm* cm [2.1 - 2.5] PA Main: nm* cm [1.5 - 2.1]  Aorta Sin: nm* cm [2.7 - 3.3] RIGHT VENTRICLE  ST Junction: nm* cm [2.3 - 2.9] RV Base: 3.5 cm [<= 4.2]  Asc.Aorta: nm* cm [2.3 - 3.1] RV Mid: 2.6 cm [<= 3.5]  LEFT VENTRICLE RV Length: 7.4 cm [<= 8.6]  LVIDd: 2.9 cm [3.9 - 5.3] INFERIOR VENA CAVA  LVIDs: 2.4 cm Max.IVC: 0.5 cm [<= 2.1]  FS: 17% [> 25%] Min.IVC: nm* cm [<= 1.7]  SWT: 1.4 cm [0.6 - 1.0] __________________  PWT: 1.4 cm [0.6 - 1.0] nm* - not measured  LEFT ATRIUM  LA Diam: 3.7 cm [2.7 - 3.8]  LA Area: 28 cm2 [< 20]  LA Volume: 81 mL [22 - 52]  ECHOCARDIOGRAPHIC DESCRIPTIONS -----------------------------------------------  AORTIC ROOT  Size: Not seen  Dissection: INDETERM FOR DISSECTION  AO Note: TAVI NOTED  AORTIC VALVE  Leaflets: OTHER PROSTHETIC Morphology: Normal  Mobility: Fully Mobile  AOV Note: TAVI NOTED  LEFT VENTRICLE Anterior: Normal  Size: SMALL Lateral: Normal  Contraction: Normal Septal: Normal  Closest EF: >55% (Estimated) Apical: Normal  LV masses: No Masses Inferior: Normal  LVH: MILD LVH CONCENTRIC Posterior: Normal  Dias.FxClass: can't be determined  MITRAL VALVE  Leaflets: ABNORMAL Mobility: PARTIALLY MOBILE  Morphology: THICKENED LEAFLET(S)  MV Note: MAC  LEFT ATRIUM  Size: MILDLY ENLARGED  LA masses: No masses  Normal IAS  MAIN PA  Size: Normal  PULMONIC VALVE  Morphology: Normal  Mobility: Fully Mobile  RIGHT VENTRICLE  Size: Normal Free wall: Normal  Contraction: Normal RV masses: CATHETER IN RV   TAPSE: 1.1 cm, Normal Range [>= 1.6 cm]  TRICUSPID VALVE  Leaflets: Normal Mobility: Fully mobile  Morphology: Normal  RIGHT ATRIUM  Size: Normal RA Other: None  RA masses: CATHETER IN RA  PERICARDIUM  Fluid: No effusion  INFERIOR VENACAVA  Size: Normal Normal respiratory collapse  DOPPLER ECHO and OTHER SPECIAL PROCEDURES ------------------------------------  Aortic: No AR OTHER PROSTHETIC AoV  1.9 m/s peak vel 14 mmHg peak grad 8 mmHg mean grad 0.9 cm2 by DOPPLER  LVOT Diam: 1.7 cm. Resting LVOT Vel: 0.6 m/s. Dimensionless Index: 0.41  Mitral: MILD MR SEVERE MS  2.8 m/s peak vel 32 mmHg peak grad 13 mmHg mean grad  MV Inflow E Vel.= nm* cm/s MV Annulus E'Vel.= nm* cm/s E/E'Ratio= nm*  Tricuspid: MILD TR No TS  2.8 m/s peak TR vel 33 mmHg peak RV pressure  Pulmonary: TRIVIAL PR No PS  Other:  INTERPRETATION ---------------------------------------------------------------  NORMAL LEFT VENTRICULAR SYSTOLIC FUNCTION WITH MILD LVH  NORMAL RIGHT VENTRICULAR SYSTOLIC FUNCTION  VALVULAR REGURGITATION: MILD MR, TRIVIAL PR, MILD TR  VALVULAR STENOSIS: SEVERE MS  S/P TAVI (CORE VALVE PROTOCOL)  MODERATE MS: THERE IS SIGNIFICANT VARIATION IN MV GRADIENT DUE TO  ARRHYTHMIA; MV MEAN GRADIENT 6-13 mmHg; MVA = 1.3 CM^2 BY PRESSURE HALF  TIME METHOD.  Compared with prior Echo study on 10/17/2012: NO SIGNIFICANT CHANGE.   2016 [facility] echo ECHOCARDIOGRAPHIC MEASUREMENTS ----------------------------------------------- 2D DIMENSIONS AORTA Values Normal Range MAIN PA Values Normal Range Annulus: nm* cm [2.1 - 2.5] PA Main: nm* cm [1.5 - 2.1] Aorta Sin: nm* cm [2.7 - 3.3] RIGHT VENTRICLE ST Junction: nm* cm [2.3 - 2.9] RV Base: nm* cm [<= 4.2] Asc.Aorta: nm* cm [2.3 - 3.1] RV Mid: nm* cm [<= 3.5] LEFT VENTRICLE RV Length: nm* cm [<= 8.6] LVIDd: 3.3 cm [3.9 - 5.3] INFERIOR VENA CAVA LVIDs: 2.3 cm Max.IVC: nm* cm [<= 2.1] FS: 30% [> 25%] Min.IVC: nm* cm [<=  1.7] SWT: 1.5 cm [0.6 - 1.0] __________________ PWT: 1.4 cm [0.6 - 1.0] nm* - not measured LEFT ATRIUM LA Diam: 3.4 cm [2.7 - 3.8] LA Area: 24 cm2 [< 20] LA Volume: 75 mL [22 - 52]  ECHOCARDIOGRAPHIC DESCRIPTIONS ----------------------------------------------- AORTIC ROOT Size: Not seen  Dissection: INDETERM FOR DISSECTION  AORTIC VALVE Leaflets: OTHER PROSTHETIC Morphology: Normal Mobility: Fully Mobile AOV Note: TAVI  LEFT VENTRICLE Anterior: Normal Size: SMALL Lateral: Normal Contraction: Normal Septal: Normal Closest EF: >55% (Estimated) Apical: Normal LV masses: No Masses Inferior: Normal LVH: MODERATE LVH CONCENTRIC Posterior: Normal Dias.FxClass: N/A  MITRAL VALVE Leaflets: ABNORMAL Mobility: PARTIALLY MOBILE Morphology: THICKENED LEAFLET(S) MV Note: MAC  LEFT ATRIUM Size: MILDLY ENLARGED LA masses: No masses Normal IAS  MAIN PA Size: Normal  PULMONIC VALVE Morphology: Normal Mobility: Fully Mobile  RIGHT VENTRICLE Size: Normal Free wall: Normal Contraction: Normal RV masses: CATHETER IN RV TAPSE: 1.3 cm, Normal Range [>= 1.6 cm]  TRICUSPID VALVE Leaflets: Normal Mobility: Fully mobile Morphology: Normal  RIGHT ATRIUM Size: Normal RA Other: None RA masses: CATHETER IN RA RA Note: AREA= 17cm2  PERICARDIUM Fluid: No effusion  INFERIOR VENACAVA Size: Normal ABNORMAL RESPIRATORY COLLAPSE  DOPPLER ECHO and OTHER SPECIAL PROCEDURES ------------------------------------ Aortic: No AR OTHER PROSTHETIC AoV 1.6 m/s peak vel 11 mmHg peak grad 5 mmHg mean grad Resting LVOT Vel: 0.7 m/s. Dimensionless Index: 0.55  Mitral: MILD MR SEVERE MS 2.6 m/s peak vel 27 mmHg peak grad 12 mmHg mean grad MV Inflow E Vel.= nm* cm/s MV Annulus E'Vel.= nm* cm/s E/E'Ratio= nm*  Tricuspid: MILD TR No TS 2.9 m/s peak TR vel 41 mmHg peak RV pressure  Pulmonary: MILD PR No PS  Other:  INTERPRETATION  --------------------------------------------------------------- NORMAL LEFT VENTRICULAR SYSTOLIC FUNCTION WITH MODERATE LVH NORMAL RIGHT VENTRICULAR SYSTOLIC FUNCTION VALVULAR REGURGITATION: MILD MR, MILD PR, MILD TR VALVULAR STENOSIS: SEVERE MS     Assessment and Plan  1. Aortic stenosis  - s/p TAVR in 2011  - denies any current symptoms. Normal function by recent echo at [facility]. - continue to follow clinically   2. HTN  - mildly elevated in clinic, previously had signifiacnt dizziness with more aggressive control. Continue current meds.   3. Pacemaker placement  - no current symptoms  - continue regular follow up in EP clinic, last check showed normal function   4. Carotid bruit  - no neurlogical symptoms  - no significant stenosis by recent US   5. Mitral stenosis   - she denies any significant symptoms  - continue to follow clinically, given her age, lack of symptoms, and borderline severity will not puruse any form of intervention at this time.   6. Afib  - denies any symptoms  - continue rate control. - discussed with patient in clinic and with her daughter by phone extensively of risk vs benefit of retrying coumadin. They will discuss over the weekend and call us back. Continue ASA for now        Antoine Poche, M.D

## 2014-09-23 NOTE — Patient Instructions (Signed)

## 2014-09-26 ENCOUNTER — Encounter: Payer: Self-pay | Admitting: Cardiology

## 2014-10-18 ENCOUNTER — Other Ambulatory Visit: Payer: Self-pay | Admitting: Cardiology

## 2014-11-02 ENCOUNTER — Ambulatory Visit (INDEPENDENT_AMBULATORY_CARE_PROVIDER_SITE_OTHER): Payer: Medicare Other | Admitting: *Deleted

## 2014-11-02 ENCOUNTER — Encounter: Payer: Self-pay | Admitting: Internal Medicine

## 2014-11-02 ENCOUNTER — Telehealth: Payer: Self-pay | Admitting: Cardiology

## 2014-11-02 DIAGNOSIS — I495 Sick sinus syndrome: Secondary | ICD-10-CM

## 2014-11-02 NOTE — Telephone Encounter (Signed)
Spoke with pt and reminded pt of remote transmission that is due today. Pt verbalized understanding.   

## 2014-11-03 NOTE — Progress Notes (Signed)
Remote pacemaker transmission.   

## 2014-11-06 LAB — CUP PACEART REMOTE DEVICE CHECK
Battery Impedance: 638 Ohm
Battery Remaining Longevity: 68 mo
Battery Voltage: 2.79 V
Brady Statistic AP VP Percent: 2 %
Brady Statistic AP VS Percent: 0 %
Brady Statistic AS VP Percent: 75 %
Brady Statistic AS VS Percent: 23 %
Date Time Interrogation Session: 20160705184421
Lead Channel Impedance Value: 488 Ohm
Lead Channel Impedance Value: 522 Ohm
Lead Channel Pacing Threshold Amplitude: 0.75 V
Lead Channel Sensing Intrinsic Amplitude: 1 mV
Lead Channel Sensing Intrinsic Amplitude: 16 mV
Lead Channel Setting Pacing Amplitude: 2 V
Lead Channel Setting Pacing Amplitude: 2.5 V
Lead Channel Setting Pacing Pulse Width: 0.4 ms
MDC IDC MSMT LEADCHNL RA PACING THRESHOLD PULSEWIDTH: 0.4 ms
MDC IDC MSMT LEADCHNL RV PACING THRESHOLD AMPLITUDE: 0.75 V
MDC IDC MSMT LEADCHNL RV PACING THRESHOLD PULSEWIDTH: 0.4 ms
MDC IDC SET LEADCHNL RV SENSING SENSITIVITY: 5.6 mV

## 2014-11-10 ENCOUNTER — Encounter: Payer: Self-pay | Admitting: Cardiology

## 2014-12-31 ENCOUNTER — Ambulatory Visit (INDEPENDENT_AMBULATORY_CARE_PROVIDER_SITE_OTHER): Payer: Medicare Other | Admitting: Internal Medicine

## 2014-12-31 ENCOUNTER — Encounter: Payer: Self-pay | Admitting: Internal Medicine

## 2014-12-31 VITALS — BP 116/66 | HR 110 | Ht 60.0 in

## 2014-12-31 DIAGNOSIS — R0602 Shortness of breath: Secondary | ICD-10-CM

## 2014-12-31 DIAGNOSIS — Z950 Presence of cardiac pacemaker: Secondary | ICD-10-CM | POA: Diagnosis not present

## 2014-12-31 DIAGNOSIS — I480 Paroxysmal atrial fibrillation: Secondary | ICD-10-CM | POA: Diagnosis not present

## 2014-12-31 DIAGNOSIS — I442 Atrioventricular block, complete: Secondary | ICD-10-CM | POA: Diagnosis not present

## 2014-12-31 MED ORDER — FUROSEMIDE 40 MG PO TABS: 40.0000 mg | ORAL_TABLET | Freq: Every day | ORAL | Status: AC

## 2014-12-31 NOTE — Progress Notes (Signed)
PCP: Ignatius Specking., MD Primary Cardiologist: Dr Marybelle Killings is a 81 y.o. female who presents today for routine electrophysiology followup.  Since last being seen in our clinic, the patient reports doing reasonably well.  She has recently noted increased SOB.  Today, she denies symptoms of palpitations, chest pain, lower extremity edema, dizziness, presyncope, or syncope.  The patient is otherwise without complaint today.   Past Medical History  Diagnosis Date  . Aortic stenosis     s/p TAVR with normal coronaries.  . DM2 (diabetes mellitus, type 2)   . Asthma   . HTN (hypertension)   . Gastroenteritis     Hx of it. last admission was 06/01/05  . Ejection fraction < 50%     echocardiogram in 08 showed preserved LV function w/EF of 55-60%.   . Diastolic heart failure     chronic  . Atypical chest pain   . COPD (chronic obstructive pulmonary disease) with emphysema   . PAD (peripheral artery disease)   . Cerebrovascular disease     noncritical   . Pacemaker     status post pacemaker after TAVR 6 2011 at [facility]  . Subclavian vein stenosis     both innominate vein and left subclavian vein stenosis secondary to PPM implantation- incidental finding neck CT scan.  . Nodule of neck     neagtive CT scan neck for malingnancy.    No past surgical history on file.  Current Outpatient Prescriptions  Medication Sig Dispense Refill  . albuterol (PROVENTIL) (2.5 MG/3ML) 0.083% nebulizer solution Take 2.5 mg by nebulization 3 (three) times daily. UAD    . albuterol (VENTOLIN HFA) 108 (90 BASE) MCG/ACT inhaler Inhale 2 puffs into the lungs every 6 (six) hours as needed. UAD.     Marland Kitchen amLODipine (NORVASC) 5 MG tablet TAKE 1 TABLET BY MOUTH EVERY DAY (DOSE DECREASE) 30 tablet 6  . aspirin 81 MG chewable tablet Chew 81 mg by mouth daily.      . Calcium Carbonate-Vitamin D (CALTRATE 600+D) 600-400 MG-UNIT per tablet Take 1 tablet by mouth 2 (two) times daily.      . cilostazol  (PLETAL) 100 MG tablet TAKE 1 TABLET BY MOUTH TWICE DAILY 60 tablet 6  . Ferrous Sulfate (IRON) 325 (65 FE) MG TABS Take 1 tablet by mouth 2 (two) times daily.      . Fluticasone-Salmeterol (ADVAIR DISKUS) 250-50 MCG/DOSE AEPB Inhale 1 puff into the lungs 2 (two) times daily.      . metoprolol succinate (TOPROL-XL) 50 MG 24 hr tablet Take 1 tablet by mouth daily.    . Multiple Vitamin (MULTIVITAMIN) tablet Take 1 tablet by mouth daily.      . nitroGLYCERIN (NITROSTAT) 0.4 MG SL tablet Place 1 tablet (0.4 mg total) under the tongue every 5 (five) minutes as needed. 25 tablet 3  . pantoprazole (PROTONIX) 40 MG tablet Take 40 mg by mouth 2 (two) times daily.      Marland Kitchen tiotropium (SPIRIVA) 18 MCG inhalation capsule Place 18 mcg into inhaler and inhale daily.       No current facility-administered medications for this visit.    Physical Exam: Filed Vitals:   12/31/14 1217  BP: 116/66  Pulse: 110  Height: 5' (1.524 m)  SpO2: 89%    GEN- The patient is well appearing, alert and oriented x 3 today.   Head- normocephalic, atraumatic Eyes-  Sclera clear, conjunctiva pink Ears- hearing intact Oropharynx- clear Neck- elevated JVP Lungs-  rales at the bases, normal work of breathing Chest- pacemaker pocket is well healed Heart- Regular rate and rhythm, 1/6 diastolic murmur at the apex GI- soft, NT, ND, + BS Extremities- no clubbing, cyanosis, or edema  Pacemaker interrogation- reviewed in detail today,  See PACEART report  Assessment and Plan:  1. Mobitz II AV block Normal pacemaker function See Pace Art report No changes today  2.  afib- low afib burden (5%) Her chads2vasc score is at least 8.  In addition, she has mitral stenosis which substantially increases her stroke risk.  I would therefore highly suggest anticoagulation with warfarin if at all possible.  We discussed this at length again today.  They would like to continue to avoid anticoagulation presently.    Given her valvular  afib, I would be reluctant to use noval anticoagulants at this time. I will defer to Dr Sherril Croon and Wyline Mood.  3.  HTN Stable No change required today  4. SOB She has chronic lung disease but also appears a little volume overloaded Today Will give lasix  daily x 3 days bmet today Follow-up with Dr Sherril Croon if not improved  [facility] Return in 1 year to see me Follow-up with Drs Sherril Croon and Wyline Mood as scheduled

## 2014-12-31 NOTE — Patient Instructions (Signed)
Your physician has recommended you make the following change in your medication: Take furosemide 40 mg daily for 3 days. Continue all other medications the same. Your physician recommends that you have lab work today to check your BMET. Device check 04/04/15. Your physician recommends that you schedule a follow-up appointment in: 1 year with Dr. Johney Frame. You will receive a reminder letter in the mail in about 10 months reminding you to call and schedule your appointment. If you don't receive this letter, please contact our office.

## 2015-01-04 LAB — CUP PACEART INCLINIC DEVICE CHECK
Battery Impedance: 636 Ohm
Battery Remaining Longevity: 66 mo
Battery Voltage: 2.79 V
Brady Statistic AP VP Percent: 2 %
Brady Statistic AP VS Percent: 0 %
Brady Statistic AS VP Percent: 87 %
Brady Statistic AS VS Percent: 11 %
Date Time Interrogation Session: 20160902163242
Lead Channel Impedance Value: 469 Ohm
Lead Channel Impedance Value: 486 Ohm
Lead Channel Pacing Threshold Amplitude: 0.75 V
Lead Channel Pacing Threshold Pulse Width: 0.4 ms
Lead Channel Sensing Intrinsic Amplitude: 2 mV
Lead Channel Setting Pacing Amplitude: 2 V
Lead Channel Setting Pacing Amplitude: 2.5 V
Lead Channel Setting Pacing Pulse Width: 0.4 ms
Lead Channel Setting Sensing Sensitivity: 5.6 mV
MDC IDC MSMT LEADCHNL RV PACING THRESHOLD AMPLITUDE: 0.5 V
MDC IDC MSMT LEADCHNL RV PACING THRESHOLD PULSEWIDTH: 0.4 ms
MDC IDC MSMT LEADCHNL RV SENSING INTR AMPL: 22.4 mV

## 2015-01-20 ENCOUNTER — Telehealth: Payer: Self-pay | Admitting: *Deleted

## 2015-01-20 ENCOUNTER — Encounter: Payer: Self-pay | Admitting: *Deleted

## 2015-01-20 NOTE — Telephone Encounter (Signed)
-----   Message from Hillis Range, MD sent at 01/05/2015 10:01 PM EDT ----- Please inform patient of results

## 2015-01-20 NOTE — Telephone Encounter (Signed)
Patient informed via letter mailed to home address.

## 2015-02-25 ENCOUNTER — Other Ambulatory Visit: Payer: Self-pay | Admitting: Cardiology

## 2015-02-25 DIAGNOSIS — I6523 Occlusion and stenosis of bilateral carotid arteries: Secondary | ICD-10-CM

## 2015-03-11 ENCOUNTER — Ambulatory Visit: Payer: Medicare Other | Admitting: Cardiology

## 2015-04-01 ENCOUNTER — Other Ambulatory Visit: Payer: Self-pay | Admitting: Cardiology

## 2015-04-01 DIAGNOSIS — I6523 Occlusion and stenosis of bilateral carotid arteries: Secondary | ICD-10-CM

## 2015-04-04 ENCOUNTER — Encounter: Payer: Medicare Other | Admitting: *Deleted

## 2015-04-04 ENCOUNTER — Telehealth: Payer: Self-pay | Admitting: Cardiology

## 2015-04-04 NOTE — Telephone Encounter (Signed)
LMOVM reminding pt to send remote transmission.   

## 2015-04-05 ENCOUNTER — Encounter: Payer: Self-pay | Admitting: Cardiology

## 2015-04-06 ENCOUNTER — Telehealth: Payer: Self-pay | Admitting: Internal Medicine

## 2015-04-06 NOTE — Telephone Encounter (Signed)
FYI   [facility]. Requesting our office call pts Margaretmary Bayleyunt Karla with any upcoming appointments or necessary device checks she may be unaware of.

## 2015-04-06 NOTE — Telephone Encounter (Signed)
Spoke w/ pt Aunt and informed her that pt missed her remote transmission on 04-04-15. Pt Aunt is going to take home monitor to facility for her send transmission. Also, made aunt aware of appt w/ MD on 05-04-15.

## 2015-04-08 ENCOUNTER — Ambulatory Visit (INDEPENDENT_AMBULATORY_CARE_PROVIDER_SITE_OTHER): Payer: Medicare Other | Admitting: *Deleted

## 2015-04-08 DIAGNOSIS — I4891 Unspecified atrial fibrillation: Secondary | ICD-10-CM | POA: Diagnosis not present

## 2015-04-08 NOTE — Progress Notes (Signed)
Remote pacemaker transmission.   

## 2015-04-15 ENCOUNTER — Encounter: Payer: Self-pay | Admitting: Cardiology

## 2015-04-15 LAB — CUP PACEART REMOTE DEVICE CHECK
Battery Impedance: 764 Ohm
Battery Remaining Longevity: 66 mo
Brady Statistic AS VS Percent: 61 %
Date Time Interrogation Session: 20161209152714
Implantable Lead Implant Date: 20110701
Implantable Lead Location: 753859
Implantable Lead Model: 5076
Implantable Lead Model: 5076
Lead Channel Impedance Value: 447 Ohm
Lead Channel Sensing Intrinsic Amplitude: 0.7 mV
Lead Channel Sensing Intrinsic Amplitude: 11.2 mV
Lead Channel Setting Pacing Amplitude: 2.5 V
Lead Channel Setting Pacing Pulse Width: 0.4 ms
MDC IDC LEAD IMPLANT DT: 20110701
MDC IDC LEAD LOCATION: 753860
MDC IDC MSMT BATTERY VOLTAGE: 2.79 V
MDC IDC MSMT LEADCHNL RA PACING THRESHOLD AMPLITUDE: 0.625 V
MDC IDC MSMT LEADCHNL RA PACING THRESHOLD PULSEWIDTH: 0.4 ms
MDC IDC MSMT LEADCHNL RV IMPEDANCE VALUE: 456 Ohm
MDC IDC MSMT LEADCHNL RV PACING THRESHOLD AMPLITUDE: 0.75 V
MDC IDC MSMT LEADCHNL RV PACING THRESHOLD PULSEWIDTH: 0.4 ms
MDC IDC SET LEADCHNL RA PACING AMPLITUDE: 2 V
MDC IDC SET LEADCHNL RV SENSING SENSITIVITY: 4 mV
MDC IDC STAT BRADY AP VP PERCENT: 2 %
MDC IDC STAT BRADY AP VS PERCENT: 1 %
MDC IDC STAT BRADY AS VP PERCENT: 36 %

## 2015-04-18 ENCOUNTER — Other Ambulatory Visit: Payer: Self-pay | Admitting: Cardiology

## 2015-04-20 ENCOUNTER — Encounter: Payer: Self-pay | Admitting: Cardiology

## 2015-05-01 DEATH — deceased

## 2015-05-04 ENCOUNTER — Ambulatory Visit: Payer: Medicare Other | Admitting: Cardiology

## 2015-05-31 ENCOUNTER — Ambulatory Visit: Payer: Medicare Other | Admitting: Cardiology
